# Patient Record
Sex: Female | Born: 1955 | Race: Black or African American | Hispanic: No | Marital: Single | Smoking: Former smoker
Health system: Southern US, Community
[De-identification: ages and names within clinical notes are randomized; demographics above are authoritative.]

## PROBLEM LIST (undated history)

## (undated) DIAGNOSIS — I739 Peripheral vascular disease, unspecified: Secondary | ICD-10-CM

## (undated) DIAGNOSIS — G35 Multiple sclerosis: Secondary | ICD-10-CM

## (undated) DIAGNOSIS — Z720 Tobacco use: Secondary | ICD-10-CM

## (undated) DIAGNOSIS — K219 Gastro-esophageal reflux disease without esophagitis: Secondary | ICD-10-CM

## (undated) DIAGNOSIS — M1990 Unspecified osteoarthritis, unspecified site: Secondary | ICD-10-CM

## (undated) DIAGNOSIS — G709 Myoneural disorder, unspecified: Secondary | ICD-10-CM

## (undated) DIAGNOSIS — N183 Chronic kidney disease, stage 3 (moderate): Secondary | ICD-10-CM

## (undated) DIAGNOSIS — E785 Hyperlipidemia, unspecified: Secondary | ICD-10-CM

## (undated) DIAGNOSIS — I10 Essential (primary) hypertension: Secondary | ICD-10-CM

## (undated) HISTORY — DX: Multiple sclerosis: G35

## (undated) HISTORY — DX: Peripheral vascular disease, unspecified: I73.9

## (undated) HISTORY — DX: Myoneural disorder, unspecified: G70.9

## (undated) HISTORY — DX: Tobacco use: Z72.0

## (undated) HISTORY — DX: Hyperlipidemia, unspecified: E78.5

---

## 1999-02-15 ENCOUNTER — Emergency Department (HOSPITAL_COMMUNITY): Admission: EM | Admit: 1999-02-15 | Discharge: 1999-02-15 | Payer: Self-pay

## 2001-07-24 ENCOUNTER — Other Ambulatory Visit: Admission: RE | Admit: 2001-07-24 | Discharge: 2001-07-24 | Payer: Self-pay | Admitting: *Deleted

## 2006-10-16 ENCOUNTER — Ambulatory Visit (HOSPITAL_COMMUNITY): Admission: RE | Admit: 2006-10-16 | Discharge: 2006-10-16 | Payer: Self-pay | Admitting: Cardiovascular Disease

## 2009-04-06 ENCOUNTER — Emergency Department (HOSPITAL_COMMUNITY): Admission: EM | Admit: 2009-04-06 | Discharge: 2009-04-06 | Payer: Self-pay | Admitting: Emergency Medicine

## 2009-04-08 ENCOUNTER — Emergency Department (HOSPITAL_COMMUNITY): Admission: EM | Admit: 2009-04-08 | Discharge: 2009-04-08 | Payer: Self-pay | Admitting: Emergency Medicine

## 2009-04-10 ENCOUNTER — Emergency Department (HOSPITAL_COMMUNITY): Admission: EM | Admit: 2009-04-10 | Discharge: 2009-04-10 | Payer: Self-pay | Admitting: Family Medicine

## 2012-01-09 ENCOUNTER — Emergency Department (HOSPITAL_COMMUNITY): Payer: Self-pay

## 2012-01-09 ENCOUNTER — Encounter (HOSPITAL_COMMUNITY): Payer: Self-pay | Admitting: Emergency Medicine

## 2012-01-09 ENCOUNTER — Other Ambulatory Visit: Payer: Self-pay

## 2012-01-09 ENCOUNTER — Observation Stay (HOSPITAL_COMMUNITY)
Admission: EM | Admit: 2012-01-09 | Discharge: 2012-01-10 | Disposition: A | Discharge status: Home / Self Care | Payer: Self-pay | Attending: Cardiovascular Disease | Admitting: Cardiovascular Disease

## 2012-01-09 DIAGNOSIS — R079 Chest pain, unspecified: Principal | ICD-10-CM | POA: Insufficient documentation

## 2012-01-09 DIAGNOSIS — K219 Gastro-esophageal reflux disease without esophagitis: Secondary | ICD-10-CM | POA: Insufficient documentation

## 2012-01-09 DIAGNOSIS — I10 Essential (primary) hypertension: Secondary | ICD-10-CM | POA: Insufficient documentation

## 2012-01-09 DIAGNOSIS — F17200 Nicotine dependence, unspecified, uncomplicated: Secondary | ICD-10-CM | POA: Insufficient documentation

## 2012-01-09 HISTORY — DX: Essential (primary) hypertension: I10

## 2012-01-09 HISTORY — DX: Gastro-esophageal reflux disease without esophagitis: K21.9

## 2012-01-09 LAB — COMPREHENSIVE METABOLIC PANEL
ALT: 16 U/L (ref 0–35)
AST: 25 U/L (ref 0–37)
Alkaline Phosphatase: 76 U/L (ref 39–117)
CO2: 24 mEq/L (ref 19–32)
GFR calc Af Amer: 41 mL/min — ABNORMAL LOW (ref >90)
GFR calc non Af Amer: 36 mL/min — ABNORMAL LOW (ref >90)
Glucose, Bld: 106 mg/dL — ABNORMAL HIGH (ref 70–99)
Potassium: 4.5 mEq/L (ref 3.5–5.1)
Sodium: 137 mEq/L (ref 135–145)
Total Protein: 7.6 g/dL (ref 6.0–8.3)

## 2012-01-09 LAB — DIFFERENTIAL
Eosinophils Relative: 2 % (ref 0–5)
Lymphocytes Relative: 36 % (ref 12–46)
Lymphocytes Relative: 39 % (ref 12–46)
Lymphs Abs: 2.8 10*3/uL (ref 0.7–4.0)
Lymphs Abs: 2.7 10*3/uL (ref 0.7–4.0)
Monocytes Absolute: 0.6 10*3/uL (ref 0.1–1.0)
Monocytes Relative: 8 % (ref 3–12)
Neutrophils Relative %: 49 % (ref 43–77)

## 2012-01-09 LAB — CBC
HCT: 34.5 % — ABNORMAL LOW (ref 36.0–46.0)
Hemoglobin: 11.5 g/dL — ABNORMAL LOW (ref 12.0–15.0)
Hemoglobin: 12.2 g/dL (ref 12.0–15.0)
MCV: 85.6 fL (ref 78.0–100.0)
Platelets: 207 10*3/uL (ref 150–400)
RBC: 4.03 MIL/uL (ref 3.87–5.11)
RBC: 4.17 MIL/uL (ref 3.87–5.11)
WBC: 7.7 10*3/uL (ref 4.0–10.5)
WBC: 6.8 10*3/uL (ref 4.0–10.5)

## 2012-01-09 LAB — CARDIAC PANEL(CRET KIN+CKTOT+MB+TROPI)
CK, MB: 2.8 ng/mL (ref 0.3–4.0)
Total CK: 255 U/L — ABNORMAL HIGH (ref 7–177)
Troponin I: <0.3 ng/mL (ref <0.30)

## 2012-01-09 LAB — PROTIME-INR
INR: 1.02 (ref 0.00–1.49)
Prothrombin Time: 13.6 seconds (ref 11.6–15.2)

## 2012-01-09 LAB — CK TOTAL AND CKMB (NOT AT ARMC): Relative Index: 1 (ref 0.0–2.5)

## 2012-01-09 MED ORDER — METOPROLOL TARTRATE 25 MG PO TABS
25.0000 mg | ORAL_TABLET | Freq: Two times a day (BID) | ORAL | Status: DC
  Administered 2012-01-10 (×2): 25 mg via ORAL
  Filled 2012-01-09 (×5): qty 1

## 2012-01-09 MED ORDER — HYDROCHLOROTHIAZIDE 12.5 MG PO CAPS
12.5000 mg | ORAL_CAPSULE | Freq: Every day | ORAL | Status: DC
  Administered 2012-01-10: 12.5 mg via ORAL
  Filled 2012-01-09 (×2): qty 1

## 2012-01-09 MED ORDER — ONDANSETRON HCL 4 MG/2ML IJ SOLN: 4.0000 mg | Freq: Four times a day (QID) | INTRAMUSCULAR | Status: DC | PRN

## 2012-01-09 MED ORDER — ASPIRIN 300 MG RE SUPP: 300.0000 mg | RECTAL | Status: AC

## 2012-01-09 MED ORDER — ZOLPIDEM TARTRATE 10 MG PO TABS: 10.0000 mg | ORAL_TABLET | Freq: Every evening | ORAL | Status: DC | PRN

## 2012-01-09 MED ORDER — SODIUM CHLORIDE 0.9 % IJ SOLN: 3.0000 mL | INTRAMUSCULAR | Status: DC | PRN

## 2012-01-09 MED ORDER — ROSUVASTATIN CALCIUM 10 MG PO TABS
10.0000 mg | ORAL_TABLET | Freq: Every day | ORAL | Status: DC
  Administered 2012-01-10: 10 mg via ORAL
  Filled 2012-01-09 (×3): qty 1

## 2012-01-09 MED ORDER — CLOPIDOGREL BISULFATE 75 MG PO TABS
75.0000 mg | ORAL_TABLET | Freq: Every day | ORAL | Status: DC
  Administered 2012-01-10: 75 mg via ORAL
  Filled 2012-01-09 (×2): qty 1

## 2012-01-09 MED ORDER — HEPARIN BOLUS VIA INFUSION
4000.0000 [IU] | Freq: Once | INTRAVENOUS | Status: AC
  Administered 2012-01-10: 4000 [IU] via INTRAVENOUS
  Filled 2012-01-09: qty 4000

## 2012-01-09 MED ORDER — ASPIRIN 81 MG PO CHEW
324.0000 mg | CHEWABLE_TABLET | ORAL | Status: AC
  Administered 2012-01-10: 324 mg via ORAL
  Filled 2012-01-09: qty 4

## 2012-01-09 MED ORDER — ASPIRIN EC 81 MG PO TBEC
81.0000 mg | DELAYED_RELEASE_TABLET | Freq: Every day | ORAL | Status: DC
  Administered 2012-01-10: 81 mg via ORAL
  Filled 2012-01-09 (×2): qty 1

## 2012-01-09 MED ORDER — SODIUM CHLORIDE 0.9 % IJ SOLN
3.0000 mL | Freq: Two times a day (BID) | INTRAMUSCULAR | Status: DC
  Administered 2012-01-10: 3 mL via INTRAVENOUS

## 2012-01-09 MED ORDER — CLOPIDOGREL BISULFATE 75 MG PO TABS
75.0000 mg | ORAL_TABLET | Freq: Once | ORAL | Status: AC
  Administered 2012-01-10: 75 mg via ORAL
  Filled 2012-01-09: qty 1

## 2012-01-09 MED ORDER — ALPRAZOLAM 0.25 MG PO TABS: 0.2500 mg | ORAL_TABLET | Freq: Two times a day (BID) | ORAL | Status: DC | PRN

## 2012-01-09 MED ORDER — OLMESARTAN MEDOXOMIL 20 MG PO TABS
20.0000 mg | ORAL_TABLET | Freq: Every day | ORAL | Status: DC
  Administered 2012-01-10: 20 mg via ORAL
  Filled 2012-01-09 (×2): qty 1

## 2012-01-09 MED ORDER — AMLODIPINE BESYLATE 5 MG PO TABS
5.0000 mg | ORAL_TABLET | Freq: Every day | ORAL | Status: DC
  Administered 2012-01-10: 5 mg via ORAL
  Filled 2012-01-09 (×2): qty 1

## 2012-01-09 MED ORDER — ACETAMINOPHEN 325 MG PO TABS: 650.0000 mg | ORAL_TABLET | ORAL | Status: DC | PRN

## 2012-01-09 MED ORDER — ASPIRIN 81 MG PO CHEW
324.0000 mg | CHEWABLE_TABLET | Freq: Once | ORAL | Status: AC
  Administered 2012-01-09: 324 mg via ORAL
  Filled 2012-01-09: qty 4

## 2012-01-09 MED ORDER — HEPARIN SOD (PORCINE) IN D5W 100 UNIT/ML IV SOLN
14.0000 [IU]/kg/h | INTRAVENOUS | Status: DC
  Administered 2012-01-10: 14 [IU]/kg/h via INTRAVENOUS
  Filled 2012-01-09: qty 250

## 2012-01-09 MED ORDER — SODIUM CHLORIDE 0.9 % IV SOLN: 250.0000 mL | INTRAVENOUS | Status: DC | PRN

## 2012-01-09 MED ORDER — NITROGLYCERIN 0.4 MG SL SUBL: 0.4000 mg | SUBLINGUAL_TABLET | SUBLINGUAL | Status: DC | PRN

## 2012-01-09 NOTE — H&P (Signed)
Ana Edwards is an 56 y.o. female.   Chief Complaint: Chest pain HPI: 56 years old black female with substernal chest pain, pressure type lasting for 5-10 minutes. No sweating or nausea. No radiation.  Past Medical History  Diagnosis Date  . GERD (gastroesophageal reflux disease)   . Hypertension       Past Surgical History  Procedure Date  . No past surgeries     History reviewed. No pertinent family history. Social History:  reports that she has been smoking.  She has never used smokeless tobacco. She reports that she does not drink alcohol or use illicit drugs.  Allergies:  Allergies  Allergen Reactions  . Tetracyclines & Related Nausea And Vomiting and Other (See Comments)    sweats    Medications Prior to Admission  Medication Dose Route Frequency Provider Last Rate Last Dose  . aspirin chewable tablet 324 mg  324 mg Oral Once Hilario Quarry, MD   324 mg at 01/09/12 1953   No current outpatient prescriptions on file as of 01/09/2012.    Results for orders placed during the hospital encounter of 01/09/12 (from the past 48 hour(s))  CBC     Status: Abnormal   Collection Time   01/09/12  7:44 PM      Component Value Range Comment   WBC 7.7  4.0 - 10.5 (K/uL)    RBC 4.03  3.87 - 5.11 (MIL/uL)    Hemoglobin 11.5 (*) 12.0 - 15.0 (g/dL)    HCT 36.6 (*) 44.0 - 46.0 (%)    MCV 85.6  78.0 - 100.0 (fL)    MCH 28.5  26.0 - 34.0 (pg)    MCHC 33.3  30.0 - 36.0 (g/dL)    RDW 34.7  42.5 - 95.6 (%)    Platelets 218  150 - 400 (K/uL)   DIFFERENTIAL     Status: Normal   Collection Time   01/09/12  7:44 PM      Component Value Range Comment   Neutrophils Relative 54  43 - 77 (%)    Neutro Abs 4.1  1.7 - 7.7 (K/uL)    Lymphocytes Relative 36  12 - 46 (%)    Lymphs Abs 2.8  0.7 - 4.0 (K/uL)    Monocytes Relative 8  3 - 12 (%)    Monocytes Absolute 0.6  0.1 - 1.0 (K/uL)    Eosinophils Relative 2  0 - 5 (%)    Eosinophils Absolute 0.2  0.0 - 0.7 (K/uL)    Basophils Relative 1   0 - 1 (%)    Basophils Absolute 0.0  0.0 - 0.1 (K/uL)   CK TOTAL AND CKMB     Status: Abnormal   Collection Time   01/09/12  7:44 PM      Component Value Range Comment   Total CK 301 (*) 7 - 177 (U/L) NO VISIBLE HEMOLYSIS   CK, MB 3.0  0.3 - 4.0 (ng/mL)    Relative Index 1.0  0.0 - 2.5    COMPREHENSIVE METABOLIC PANEL     Status: Abnormal   Collection Time   01/09/12  7:44 PM      Component Value Range Comment   Sodium 137  135 - 145 (mEq/L)    Potassium 4.5  3.5 - 5.1 (mEq/L) NO VISIBLE HEMOLYSIS   Chloride 104  96 - 112 (mEq/L)    CO2 24  19 - 32 (mEq/L)    Glucose, Bld 106 (*) 70 - 99 (  mg/dL)    BUN 19  6 - 23 (mg/dL)    Creatinine, Ser 4.09 (*) 0.50 - 1.10 (mg/dL)    Calcium 9.7  8.4 - 10.5 (mg/dL)    Total Protein 7.6  6.0 - 8.3 (g/dL)    Albumin 3.9  3.5 - 5.2 (g/dL)    AST 25  0 - 37 (U/L) NO VISIBLE HEMOLYSIS   ALT 16  0 - 35 (U/L)    Alkaline Phosphatase 76  39 - 117 (U/L)    Total Bilirubin 0.3  0.3 - 1.2 (mg/dL)    GFR calc non Af Amer 36 (*) >90 (mL/min)    GFR calc Af Amer 41 (*) >90 (mL/min)   TROPONIN I     Status: Normal   Collection Time   01/09/12  7:44 PM      Component Value Range Comment   Troponin I <0.30  <0.30 (ng/mL)    Dg Chest Port 1 View  01/09/2012  *RADIOLOGY REPORT*  Clinical Data: Chest pain.  PORTABLE CHEST - 1 VIEW  Comparison: None.  Findings: Heart size and pulmonary vascularity are normal and the lungs are clear.  No osseous abnormality.  IMPRESSION: No acute disease.  Original Report Authenticated By: Gwynn Burly, M.D.    @ROS @  Blood pressure 124/61, pulse 53, temperature 98.3 F (36.8 C), temperature source Oral, resp. rate 16, SpO2 99.00%. Constitutional: She is oriented to person, place, and time. She appears well-developed and well-nourished.  Head: Normocephalic and atraumatic.  Eyes: Brown eyes, Conjunctivae and EOM are normal. Pupils are equal, round, and reactive to light.  Neck: Normal range of motion. Neck supple. No  JVD. Cardiovascular: Normal rate, regular rhythm and normal heart sounds.  Pulmonary/Chest: Effort normal and breath sounds normal.  Abdominal: Soft. Bowel sounds are normal.  Musculoskeletal: Normal range of motion.  Neurological: She is alert and oriented to person, place, and time. She has normal reflexes.  Skin: Skin is warm and dry.  Psychiatric: She has a normal mood and affect.     Assessment/Plan Chest pain Hypertension  Place in observation R/O MI Stress test IP/OP.  Dam Ashraf S 01/09/2012, 9:24 PM

## 2012-01-09 NOTE — ED Notes (Signed)
Per pt, chest pain/discomfort over past week-sx's increased today-no n/v, dizziness, no radiation

## 2012-01-09 NOTE — ED Provider Notes (Signed)
History     CSN: [id number]  Arrival date & time 01/09/12  1752   First MD Initiated Contact with Patient 01/09/12 1914      Chief Complaint  Patient presents with  . Chest Pain    (Consider location/radiation/quality/duration/timing/severity/associated sxs/prior treatment) HPI Pain upper chest pressure in nature comes and goes for a week.  Today returned.  Patient saw DR. Algie Coffer last week and gave amoxicillin for possible early pneumonia.  Patient continues to have chest pain.  Pain not present currently.  Pain lasts 5-10 minutes.  Pain occurs with exertion.  Denies fh cad, no dm- patient with hypertension and smoker.  Pain is different from gerd. Pain 4/10 at worst.  Past Medical History  Diagnosis Date  . GERD (gastroesophageal reflux disease)   . Hypertension    reviewed History reviewed. No pertinent past surgical history.  No family history on file.  History  Substance Use Topics  . Smoking status: Current Everyday Smoker -- 0.5 packs/day  . Smokeless tobacco: Not on file  . Alcohol Use:     OB History    Grav Para Term Preterm Abortions TAB SAB Ect Mult Living                  Review of Systems  All other systems reviewed and are negative.    Allergies  Tetracyclines & related  Home Medications   Current Outpatient Rx  Name Route Sig Dispense Refill  . AMOXICILLIN 500 MG PO TABS Oral Take 500 mg by mouth every 8 (eight) hours.    Marland Kitchen OLMESARTAN-AMLODIPINE-HCTZ 20-5-12.5 MG PO TABS Oral Take 1 tablet by mouth daily.    Marland Kitchen OMEPRAZOLE 20 MG PO CPDR Oral Take 20 mg by mouth daily as needed. Acid reflux      BP 120/63  Pulse 52  Temp(Src) 98.3 F (36.8 C) (Oral)  Resp 16  SpO2 100%  Physical Exam  Nursing note and vitals reviewed. Constitutional: She is oriented to person, place, and time. She appears well-developed and well-nourished.  HENT:  Head: Normocephalic and atraumatic.  Eyes: Conjunctivae and EOM are normal. Pupils are equal, round, and  reactive to light.  Neck: Normal range of motion. Neck supple.  Cardiovascular: Normal rate, regular rhythm and normal heart sounds.   Pulmonary/Chest: Effort normal and breath sounds normal.  Abdominal: Soft. Bowel sounds are normal.  Musculoskeletal: Normal range of motion.  Neurological: She is alert and oriented to person, place, and time. She has normal reflexes.  Skin: Skin is warm and dry.  Psychiatric: She has a normal mood and affect.    ED Course  Procedures (including critical care time)  Labs Reviewed - No data to display No results found.   No diagnosis found.  Date: 01/09/2012  Rate: 56  Rhythm: normal sinus rhythm  QRS Axis: normal  Intervals: normal  ST/T Wave abnormalities: nonspecific ST changes  Conduction Disutrbances:none  Narrative Interpretation:   Old EKG Reviewed: none available     MDM  I spoke with Dr. Fausto Skillern on call for cardiology and he will be in to see the patient. She has not had any pain here and has received aspirin.       Hilario Quarry, MD 01/09/12 2024

## 2012-01-10 ENCOUNTER — Other Ambulatory Visit: Payer: Self-pay

## 2012-01-10 LAB — LIPID PANEL
Cholesterol: 197 mg/dL (ref 0–200)
HDL: 50 mg/dL (ref >39)
Total CHOL/HDL Ratio: 3.9 RATIO
Triglycerides: 64 mg/dL (ref <150)
VLDL: 13 mg/dL (ref 0–40)

## 2012-01-10 LAB — BASIC METABOLIC PANEL
BUN: 21 mg/dL (ref 6–23)
Calcium: 9.3 mg/dL (ref 8.4–10.5)
Creatinine, Ser: 1.75 mg/dL — ABNORMAL HIGH (ref 0.50–1.10)
GFR calc Af Amer: 37 mL/min — ABNORMAL LOW (ref >90)
GFR calc non Af Amer: 32 mL/min — ABNORMAL LOW (ref >90)

## 2012-01-10 LAB — CBC
HCT: 34 % — ABNORMAL LOW (ref 36.0–46.0)
MCHC: 33.5 g/dL (ref 30.0–36.0)
MCV: 85.4 fL (ref 78.0–100.0)
Platelets: 217 10*3/uL (ref 150–400)
RDW: 13.5 % (ref 11.5–15.5)

## 2012-01-10 LAB — HEPARIN LEVEL (UNFRACTIONATED): Heparin Unfractionated: 0.7 IU/mL (ref 0.30–0.70)

## 2012-01-10 LAB — CARDIAC PANEL(CRET KIN+CKTOT+MB+TROPI): Total CK: 234 U/L — ABNORMAL HIGH (ref 7–177)

## 2012-01-10 MED ORDER — HEPARIN SOD (PORCINE) IN D5W 100 UNIT/ML IV SOLN
950.0000 [IU]/h | INTRAVENOUS | Status: DC
  Filled 2012-01-10: qty 250

## 2012-01-10 MED ORDER — ROSUVASTATIN CALCIUM 10 MG PO TABS: 10.0000 mg | ORAL_TABLET | Freq: Every day | ORAL | Status: DC

## 2012-01-10 MED ORDER — CLOPIDOGREL BISULFATE 75 MG PO TABS: 75.0000 mg | ORAL_TABLET | Freq: Every day | ORAL | Status: AC

## 2012-01-10 NOTE — Discharge Summary (Signed)
Physician Discharge Summary  Patient ID: Ana Edwards MRN: [id number] DOB/AGE: [birth year] 56 y.o.  Admit date: 01/09/2012 Discharge date: 01/10/2012  Admission Diagnoses: Chest pain Hypertension Tobacco use disorder  Discharge Diagnoses:  Principal Problem:  *Chest pain at rest Active Problems:  Hypertension Tobacco use disorder  Discharged Condition: good  Hospital Course: 56 years old black female with hypertension and tobacco use disorder had substernal chest pain. Cardiac enzymes were negative x 3.   No chest pain since admission. Patient prefers treadmill stress test done as out patient.  Consults: cardiology  Significant Diagnostic Studies: labs: Near normal CBC, BMET and cardiac enzymes.Mildly elevated LDL cholesterol.   Treatments: cardiac meds: metoprolol and HCTZ,   Discharge Exam: Blood pressure 96/51, pulse 57, temperature 97.6 F (36.4 C), temperature source Oral, resp. rate 18, height 5\' 11"  (1.803 m), weight 74.481 kg (164 lb 3.2 oz), SpO2 100.00%. Constitutional: She is oriented to person, place, and time. She appears well-developed and well-nourished.  Head: Normocephalic and atraumatic.  Eyes: Brown eyes, Conjunctivae and EOM are normal. Pupils are equal, round, and reactive to light.  Neck: Normal range of motion. Neck supple. No JVD.  Cardiovascular: Normal rate, regular rhythm and normal heart sounds.  Pulmonary/Chest: Effort normal and breath sounds normal.  Abdominal: Soft. Bowel sounds are normal.  Musculoskeletal: Normal range of motion.  Neurological: She is alert and oriented to person, place, and time. She has normal reflexes.  Skin: Skin is warm and dry.  Psychiatric: She has a normal mood and affect.  Disposition: Home/self care                    Medication List  As of 01/10/2012  3:26 PM   ASK your doctor about these medications         amoxicillin 500 MG tablet   Commonly known as: AMOXIL   Take 500 mg by mouth every 8 (eight)  hours.      omeprazole 20 MG capsule   Commonly known as: PRILOSEC   Take 20 mg by mouth daily as needed. Acid reflux      TRIBENZOR 20-5-12.5 MG Tabs   Generic drug: Olmesartan-Amlodipine-HCTZ   Take 1 tablet by mouth daily.             SignedOrpah Cobb S 01/10/2012, 3:26 PM

## 2012-01-10 NOTE — Progress Notes (Signed)
ANTICOAGULATION CONSULT NOTE - Follow Up Consult  Pharmacy Consult for Heparin Indication: chest pain/ACS  Allergies  Allergen Reactions  . Tetracyclines & Related Nausea And Vomiting and Other (See Comments)    sweats    Patient Measurements: Height: 5\' 11"  (180.3 cm) Weight: 164 lb 3.2 oz (74.481 kg) (standing scale) IBW/kg (Calculated) : 70.8   Vital Signs: Temp: 98.3 F (36.8 C) (01/22 0524) Temp src: Oral (01/22 0524) BP: 112/69 mmHg (01/22 0524) Pulse Rate: 57  (01/22 0524)  Labs:  Basename 01/10/12 0802 01/10/12 0500 01/10/12 0445 01/09/12 2255 01/09/12 1944  HGB -- 11.4* -- 12.2 --  HCT -- 34.0* -- 35.6* 34.5*  PLT -- 217 -- 207 218  APTT -- -- -- 28 --  LABPROT -- -- -- 13.6 --  INR -- -- -- 1.02 --  HEPARINUNFRC 0.70 -- -- -- --  CREATININE -- -- 1.75* -- 1.59*  CKTOTAL -- -- 234* 255* 301*  CKMB -- -- 2.7 2.8 3.0  TROPONINI -- -- <0.30 <0.30 <0.30   Estimated Creatinine Clearance: 40.6 ml/min (by C-G formula based on Cr of 1.75).   Medications:  Scheduled:    . amLODipine  5 mg Oral Daily  . aspirin  324 mg Oral Once  . aspirin  324 mg Oral NOW   Or  . aspirin  300 mg Rectal NOW  . aspirin EC  81 mg Oral Daily  . clopidogrel  75 mg Oral Once  . clopidogrel  75 mg Oral Q breakfast  . heparin  4,000 Units Intravenous Once  . hydrochlorothiazide  12.5 mg Oral Daily  . metoprolol tartrate  25 mg Oral BID  . olmesartan  20 mg Oral Daily  . rosuvastatin  10 mg Oral q1800  . sodium chloride  3 mL Intravenous Q12H   Infusions:    . heparin 14 Units/kg/hr (01/10/12 0030)   PRN: sodium chloride, acetaminophen, ALPRAZolam, nitroGLYCERIN, ondansetron (ZOFRAN) IV, sodium chloride, zolpidem  Assessment: 56 yo F on IV heparin for chest pain, r/o MI. Heparin currently running at 1050 units/hr. Heparin level at upper end of therapeutic range. No bleeding complications reported in chart. Will reduce heparin rate and recheck level in 6 hours.  Goal of  Therapy:  Heparin level 0.3-0.7 units/ml   Plan:  1)  Decrease heparin to 950 units/hr (9.68ml/hr) 2)  Recheck heparin level in 6 hours (15:30)   Annia Belt, PharmD Pager: [phone] 01/10/2012,8:58 AM

## 2012-01-10 NOTE — Progress Notes (Signed)
ANTICOAGULATION CONSULT NOTE - Initial Consult  Pharmacy Consult for Heparin Indication: chest pain/ACS  Allergies  Allergen Reactions  . Tetracyclines & Related Nausea And Vomiting and Other (See Comments)    sweats    Patient Measurements: Height: 5\' 11"  (180.3 cm) Weight: 164 lb 14.4 oz (74.798 kg) (standing weight) IBW/kg (Calculated) : 70.8  Heparin Dosing Weight:   Vital Signs: Temp: 98 F (36.7 C) (01/21 2306) Temp src: Oral (01/21 2306) BP: 109/58 mmHg (01/22 0026) Pulse Rate: 61  (01/22 0026)  Labs:  Basename 01/09/12 2255 01/09/12 1944  HGB 12.2 11.5*  HCT 35.6* 34.5*  PLT 207 218  APTT 28 --  LABPROT 13.6 --  INR 1.02 --  HEPARINUNFRC -- --  CREATININE -- 1.59*  CKTOTAL 255* 301*  CKMB 2.8 3.0  TROPONINI <0.30 <0.30   Estimated Creatinine Clearance: 44.7 ml/min (by C-G formula based on Cr of 1.59).  Medical History: Past Medical History  Diagnosis Date  . GERD (gastroesophageal reflux disease)   . Hypertension     Medications:  Infusions:    . heparin 14 Units/kg/hr (01/10/12 0030)    Assessment: Patient with ACS and heparin per pharmacy.  Goal of Therapy:  Heparin level 0.3-0.7 units/ml   Plan:  Heparin bolus and then drip at 1050 units/hr, check heparin level at 0800 and daily.  Darlina Guys, Jacquenette Shone Crowford 01/10/2012,3:19 AM

## 2012-01-10 NOTE — Progress Notes (Signed)
Patient discharge to home, ambulatory, PIV removed no s/s of infiltration, no swelling noted, D/C instructions given and discussed  to patient verbalized understandingh.

## 2013-01-19 DEATH — deceased

## 2013-12-30 ENCOUNTER — Ambulatory Visit: Payer: Self-pay | Admitting: Podiatry

## 2014-01-06 ENCOUNTER — Encounter: Payer: Self-pay | Admitting: Podiatry

## 2014-01-06 ENCOUNTER — Ambulatory Visit (INDEPENDENT_AMBULATORY_CARE_PROVIDER_SITE_OTHER): Payer: BC Managed Care – PPO

## 2014-01-06 ENCOUNTER — Ambulatory Visit (INDEPENDENT_AMBULATORY_CARE_PROVIDER_SITE_OTHER): Payer: BC Managed Care – PPO | Admitting: Podiatry

## 2014-01-06 VITALS — BP 149/80 | HR 67 | Resp 18

## 2014-01-06 DIAGNOSIS — R0989 Other specified symptoms and signs involving the circulatory and respiratory systems: Secondary | ICD-10-CM

## 2014-01-06 DIAGNOSIS — S93609A Unspecified sprain of unspecified foot, initial encounter: Secondary | ICD-10-CM

## 2014-01-06 DIAGNOSIS — M79609 Pain in unspecified limb: Secondary | ICD-10-CM

## 2014-01-06 NOTE — Patient Instructions (Signed)
Today I am ordering an arterial Doppler because I had difficulty feeling your pulses in your feet. We'll help you arranged this examination and will notify you up on receipt of the results.

## 2014-01-06 NOTE — Progress Notes (Signed)
   Subjective:    Patient ID: Ana Edwards, female    DOB: 06/23/1956, 58 y.o.   MRN: [id number]  HPI my left foot has been bothering me since may and I went on a cruise and fell down a step and bruised up and is swellling and hooked my 3rd and 4th toe on a piece of furniture 2 months ago and hurts at night and hurts if I stand a long time  This patient describes 2 episodes of trauma to the left foot. First episode June 2014 describes some bruising forefoot area which appeared to be improving with ice and rest. She then describes "hooking"  her toes and November 2014. Now she describes some continuous pain in the dorsum of left foot and lateral toe area of the left foot  Review of Systems  Constitutional: Negative.   HENT: Negative.   Eyes: Negative.   Respiratory: Negative.   Cardiovascular: Negative.   Gastrointestinal: Negative.   Endocrine: Negative.   Genitourinary: Negative.   Musculoskeletal: Negative.   Skin: Negative.   Allergic/Immunologic: Negative.   Neurological: Negative.   Hematological: Negative.   Psychiatric/Behavioral: Negative.        Objective:   Physical Exam Orientated x3 female  Vascular DP and PT pulses are trace palpable bilaterally  Neurological: Knee and ankle reflexes equal and reactive bilaterally  Dermatological: Texture and turgor within normal limits bilaterally  Musculoskeletal: Bilateral HAV deformities noted bilaterally. No restriction ankle subtalar midtarsal joint bilaterally mild palpable tenderness dorsal left midfoot and lateral toes left without a palpable lesions. Manual muscle testing dorsi flexion, plantar flexion, inversion, eversion are 5 over 5 bilaterally.  X-ray did not demonstrate any fractures and a left foot       Assessment & Plan:   Assessment: Sprain left foot Rule out performed to disease because are diminished pedal pulses bilaterally  Plan: Patient advised to wear a sturdy lace up shoe on a daily basis.  Refer patient to vascular lab for lower extremity arterial Doppler for the indication are diminished pedal pulses bilaterally. Notify patient on receipt of vascular exam.

## 2014-01-07 ENCOUNTER — Telehealth (HOSPITAL_COMMUNITY): Payer: Self-pay | Admitting: *Deleted

## 2014-01-13 ENCOUNTER — Telehealth (HOSPITAL_COMMUNITY): Payer: Self-pay | Admitting: *Deleted

## 2014-01-21 ENCOUNTER — Telehealth (HOSPITAL_COMMUNITY): Payer: Self-pay | Admitting: *Deleted

## 2014-01-24 ENCOUNTER — Telehealth: Payer: Self-pay | Admitting: *Deleted

## 2014-01-24 ENCOUNTER — Encounter: Payer: Self-pay | Admitting: Cardiovascular Disease

## 2014-01-24 ENCOUNTER — Ambulatory Visit (HOSPITAL_COMMUNITY)
Admission: RE | Admit: 2014-01-24 | Discharge: 2014-01-24 | Disposition: A | Discharge status: Home / Self Care | Payer: BC Managed Care – PPO | Source: Ambulatory Visit | Attending: Cardiovascular Disease | Admitting: Cardiovascular Disease

## 2014-01-24 ENCOUNTER — Ambulatory Visit (INDEPENDENT_AMBULATORY_CARE_PROVIDER_SITE_OTHER): Payer: BC Managed Care – PPO | Admitting: Cardiovascular Disease

## 2014-01-24 VITALS — BP 138/70 | HR 68 | Ht 70.5 in | Wt 166.0 lb

## 2014-01-24 DIAGNOSIS — R5383 Other fatigue: Secondary | ICD-10-CM

## 2014-01-24 DIAGNOSIS — R0989 Other specified symptoms and signs involving the circulatory and respiratory systems: Secondary | ICD-10-CM

## 2014-01-24 DIAGNOSIS — I739 Peripheral vascular disease, unspecified: Secondary | ICD-10-CM

## 2014-01-24 DIAGNOSIS — D689 Coagulation defect, unspecified: Secondary | ICD-10-CM

## 2014-01-24 DIAGNOSIS — R5381 Other malaise: Secondary | ICD-10-CM

## 2014-01-24 DIAGNOSIS — I70219 Atherosclerosis of native arteries of extremities with intermittent claudication, unspecified extremity: Secondary | ICD-10-CM | POA: Insufficient documentation

## 2014-01-24 DIAGNOSIS — Z79899 Other long term (current) drug therapy: Secondary | ICD-10-CM

## 2014-01-24 DIAGNOSIS — Z01818 Encounter for other preprocedural examination: Secondary | ICD-10-CM

## 2014-01-24 NOTE — Progress Notes (Signed)
Arterial Lower Ext. Duplex Completed. Cleophus Mendonsa, BS, RDMS, RVT  

## 2014-01-24 NOTE — Assessment & Plan Note (Signed)
The patient was referred by Dr. Durel Salts from [facility] for bilateral foot pain left greater than right as well as what sounds like claudication. Her risk factors include continued tobacco abuse and treated hypertension. She's never had a heart attack or stroke, but denies chest pain or shortness of breath. Dopplers performed in the office showed multilevel of obstructive disease with moderately diminished ABIs. Admission she by angiography and potential percutaneous intervention for lifestyle limiting claudication.

## 2014-01-24 NOTE — Patient Instructions (Addendum)
Dr. Allyson Sabal has ordered a peripheral angiogram to be done at [facility].  This procedure is going to look at the bloodflow in your lower extremities.  If Dr. Allyson Sabal is able to open up the arteries, you will have to spend one night in the hospital.  If he is not able to open the arteries, you will be able to go home that same day.    After the procedure, you will not be allowed to drive for 3 days or push, pull, or lift anything greater than 10 lbs for one week.    You will be required to have bloodwork prior to your procedure.  Our scheduler will advise you on when this needs to be done.       REPS: Vernia Buff and Minerva Areola

## 2014-01-24 NOTE — Telephone Encounter (Signed)
Pt is schedule today at 1100am with Dr Nanetta BattyJonathan Berry after dopplers results are positive for arterial disease B/L.  I will inform Dr Leeanne Deeduchman.

## 2014-01-24 NOTE — Progress Notes (Signed)
   01/24/2014 Marye T Burgard   05/27/1956  [id number]  Primary Physician KADAKIA,AJAY S, MD Primary Cardiologist: Rosaline Ezekiel J. Iisha Soyars MD FACP,FACC,FAHA, FSCAI   HPI:  Ana Edwards is a 57-year-old moderately overweight divorced African American female mother of 4, grandmother scheduled and referred by Dr. Richard Tuchman for peripheral vascular evaluation because of life limiting claudication. Her risk factors include continued tobacco abuse and hypertension. She's not diabetic nor is a family history of heart disease. She has never had a heart attack or stroke patient denies chest pain or shortness of breath. 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 The patient relates about code last 6 months. Left is greater than right.   Current Outpatient Prescriptions  Medication Sig Dispense Refill  . amLODipine (NORVASC) 5 MG tablet Take 5 mg by mouth daily.       . metoprolol (LOPRESSOR) 50 MG tablet Take 50 mg by mouth 2 (two) times daily.      . olmesartan (BENICAR) 20 MG tablet Take 10 mg by mouth daily.      . omeprazole (PRILOSEC) 20 MG capsule Take 20 mg by mouth daily as needed. Acid reflux       No current facility-administered medications for this visit.    Allergies  Allergen Reactions  . Tetracyclines & Related Nausea And Vomiting and Other (See Comments)    sweats    History   Social History  . Marital Status: Married    Spouse Name: N/A    Number of Children: N/A  . Years of Education: N/A   Occupational History  . Not on file.   Social History Main Topics  . Smoking status: Current Some Day Smoker -- 15 years  . Smokeless tobacco: Never Used     Comment: 1 pack per week  . Alcohol Use: No  . Drug Use: No  . Sexual Activity: Not on file   Other Topics Concern  . Not on file   Social History Narrative  . No narrative on file     Review of Systems: General: negative for chills,  fever, night sweats or weight changes.  Cardiovascular: negative for chest pain, dyspnea on exertion, edema, orthopnea, palpitations, paroxysmal nocturnal dyspnea or shortness of breath Dermatological: negative for rash Respiratory: negative for cough or wheezing Urologic: negative for hematuria Abdominal: negative for nausea, vomiting, diarrhea, bright red blood per rectum, melena, or hematemesis Neurologic: negative for visual changes, syncope, or dizziness All other systems reviewed and are otherwise negative except as noted above.    Blood pressure 138/70, pulse 68, height 5' 10.5" (1.791 m), weight 166 lb (75.297 kg).  General appearance: alert and no distress Neck: no adenopathy, no carotid bruit, no JVD, supple, symmetrical, trachea midline and thyroid not enlarged, symmetric, no tenderness/mass/nodules Lungs: clear to auscultation bilaterally Heart: regular rate and rhythm, S1, S2 normal, no murmur, click, rub or gallop Abdomen: soft, non-tender; bowel sounds normal; no masses,  no organomegaly Extremities: extremities normal, atraumatic, no cyanosis or edema and absent pedal pulses  EKG not performed today  ASSESSMENT AND PLAN:   Claudication The patient was referred by Dr. Richard Tuckman from [facility] for bilateral foot pain left greater than right as well as what sounds like claudication. Her risk factors include continued tobacco abuse and treated hypertension. She's never had a heart attack or stroke, but denies chest pain or shortness of breath.   Dopplers performed in the office showed multilevel of obstructive disease with moderately diminished ABIs. Admission she by angiography and potential percutaneous intervention for lifestyle limiting claudication.      Runell Gess MD FACP,FACC,FAHA, [facility] 01/24/2014 12:10 PM

## 2014-01-29 ENCOUNTER — Encounter (HOSPITAL_COMMUNITY): Payer: Self-pay | Admitting: Pharmacy Technician

## 2014-01-29 LAB — CBC
HEMATOCRIT: 38 % (ref 36.0–46.0)
Hemoglobin: 12.8 g/dL (ref 12.0–15.0)
MCH: 28.5 pg (ref 26.0–34.0)
MCHC: 33.7 g/dL (ref 30.0–36.0)
MCV: 84.6 fL (ref 78.0–100.0)
Platelets: 269 10*3/uL (ref 150–400)
RBC: 4.49 MIL/uL (ref 3.87–5.11)
RDW: 15 % (ref 11.5–15.5)
WBC: 6.4 10*3/uL (ref 4.0–10.5)

## 2014-01-29 LAB — BASIC METABOLIC PANEL
BUN: 14 mg/dL (ref 6–23)
CALCIUM: 9.1 mg/dL (ref 8.4–10.5)
CHLORIDE: 105 meq/L (ref 96–112)
CO2: 29 meq/L (ref 19–32)
Creat: 1.31 mg/dL — ABNORMAL HIGH (ref 0.50–1.10)
GLUCOSE: 98 mg/dL (ref 70–99)
POTASSIUM: 4.2 meq/L (ref 3.5–5.3)
SODIUM: 139 meq/L (ref 135–145)

## 2014-01-29 LAB — PROTIME-INR
INR: 0.98 (ref <1.50)
Prothrombin Time: 12.9 seconds (ref 11.6–15.2)

## 2014-01-29 LAB — APTT: aPTT: 27 seconds (ref 24–37)

## 2014-01-30 ENCOUNTER — Telehealth: Payer: Self-pay | Admitting: Cardiovascular Disease

## 2014-01-30 ENCOUNTER — Ambulatory Visit
Admission: RE | Admit: 2014-01-30 | Discharge: 2014-01-30 | Disposition: A | Discharge status: Home / Self Care | Payer: BC Managed Care – PPO | Source: Ambulatory Visit | Attending: Cardiovascular Disease | Admitting: Cardiovascular Disease

## 2014-01-30 DIAGNOSIS — I739 Peripheral vascular disease, unspecified: Secondary | ICD-10-CM

## 2014-01-30 LAB — TSH: TSH: 2.566 u[IU]/mL (ref 0.350–4.500)

## 2014-01-30 NOTE — Telephone Encounter (Signed)
Ruby at [facility] stated pt arrived for CXR and has no order.  Chart reviewed and no order placed.  Samara Deist, RN notified and advised CXR be ordered as pt is a new pt eval for claudication.  Orders placed and Ruby notified.  Verbalized understanding.  Order released.

## 2014-01-31 NOTE — Progress Notes (Signed)
Dr. Leeanne Deed,  Pt had dopplers and saw Dr. Allyson Sabal on 01/24/2014.  Pt is scheduled for an angiogram on 02/03/2014.  Joya San

## 2014-02-03 ENCOUNTER — Encounter (HOSPITAL_COMMUNITY): Payer: Self-pay | Admitting: General Practice

## 2014-02-03 ENCOUNTER — Ambulatory Visit (HOSPITAL_COMMUNITY)
Admission: RE | Admit: 2014-02-03 | Discharge: 2014-02-04 | Disposition: A | Discharge status: Home / Self Care | Payer: BC Managed Care – PPO | Source: Ambulatory Visit | Attending: Cardiovascular Disease | Admitting: Cardiovascular Disease

## 2014-02-03 ENCOUNTER — Encounter (HOSPITAL_COMMUNITY)
Admission: RE | Disposition: A | Discharge status: Home / Self Care | Payer: Self-pay | Source: Ambulatory Visit | Attending: Cardiovascular Disease

## 2014-02-03 DIAGNOSIS — N183 Chronic kidney disease, stage 3 unspecified: Secondary | ICD-10-CM | POA: Insufficient documentation

## 2014-02-03 DIAGNOSIS — Z01818 Encounter for other preprocedural examination: Secondary | ICD-10-CM

## 2014-02-03 DIAGNOSIS — I70219 Atherosclerosis of native arteries of extremities with intermittent claudication, unspecified extremity: Secondary | ICD-10-CM

## 2014-02-03 DIAGNOSIS — E663 Overweight: Secondary | ICD-10-CM | POA: Insufficient documentation

## 2014-02-03 DIAGNOSIS — I10 Essential (primary) hypertension: Secondary | ICD-10-CM | POA: Diagnosis present

## 2014-02-03 DIAGNOSIS — F17200 Nicotine dependence, unspecified, uncomplicated: Secondary | ICD-10-CM | POA: Insufficient documentation

## 2014-02-03 DIAGNOSIS — K219 Gastro-esophageal reflux disease without esophagitis: Secondary | ICD-10-CM | POA: Insufficient documentation

## 2014-02-03 DIAGNOSIS — I129 Hypertensive chronic kidney disease with stage 1 through stage 4 chronic kidney disease, or unspecified chronic kidney disease: Secondary | ICD-10-CM | POA: Insufficient documentation

## 2014-02-03 DIAGNOSIS — I739 Peripheral vascular disease, unspecified: Secondary | ICD-10-CM

## 2014-02-03 DIAGNOSIS — D649 Anemia, unspecified: Secondary | ICD-10-CM | POA: Insufficient documentation

## 2014-02-03 HISTORY — PX: PERIPHERAL ATHRECTOMY: SHX6227

## 2014-02-03 HISTORY — DX: Chronic kidney disease, stage 3 (moderate): N18.3

## 2014-02-03 HISTORY — DX: Peripheral vascular disease, unspecified: I73.9

## 2014-02-03 HISTORY — DX: Unspecified osteoarthritis, unspecified site: M19.90

## 2014-02-03 HISTORY — PX: LOWER EXTREMITY ANGIOGRAM: SHX5508

## 2014-02-03 LAB — POCT ACTIVATED CLOTTING TIME
ACTIVATED CLOTTING TIME: 182 s
ACTIVATED CLOTTING TIME: 210 s
ACTIVATED CLOTTING TIME: 243 s
ACTIVATED CLOTTING TIME: 215 s
Activated Clotting Time: 232 seconds
Activated Clotting Time: 160 seconds

## 2014-02-03 LAB — GLUCOSE, CAPILLARY: Glucose-Capillary: 110 mg/dL — ABNORMAL HIGH (ref 70–99)

## 2014-02-03 SURGERY — ANGIOGRAM, LOWER EXTREMITY
Anesthesia: LOCAL | Laterality: Right

## 2014-02-03 MED ORDER — CLONIDINE HCL 0.1 MG PO TABS
0.1000 mg | ORAL_TABLET | ORAL | Status: AC
  Administered 2014-02-03: 15:00:00 0.1 mg via ORAL
  Filled 2014-02-03: qty 1

## 2014-02-03 MED ORDER — IRBESARTAN 150 MG PO TABS
150.0000 mg | ORAL_TABLET | Freq: Every day | ORAL | Status: DC
  Filled 2014-02-03: qty 1

## 2014-02-03 MED ORDER — MORPHINE SULFATE 2 MG/ML IJ SOLN: 2.0000 mg | INTRAMUSCULAR | Status: DC | PRN

## 2014-02-03 MED ORDER — DIAZEPAM 5 MG PO TABS
5.0000 mg | ORAL_TABLET | ORAL | Status: AC
  Administered 2014-02-03: 5 mg via ORAL
  Filled 2014-02-03: qty 1

## 2014-02-03 MED ORDER — ASPIRIN EC 81 MG PO TBEC: 81.0000 mg | DELAYED_RELEASE_TABLET | ORAL | Status: DC

## 2014-02-03 MED ORDER — SODIUM CHLORIDE 0.9 % IJ SOLN: 3.0000 mL | INTRAMUSCULAR | Status: DC | PRN

## 2014-02-03 MED ORDER — ASPIRIN 81 MG PO CHEW
81.0000 mg | CHEWABLE_TABLET | ORAL | Status: DC
  Filled 2014-02-03: qty 1

## 2014-02-03 MED ORDER — CLOPIDOGREL BISULFATE 75 MG PO TABS
300.0000 mg | ORAL_TABLET | Freq: Once | ORAL | Status: AC
  Administered 2014-02-03: 15:00:00 300 mg via ORAL

## 2014-02-03 MED ORDER — SODIUM CHLORIDE 0.9 % IV SOLN
INTRAVENOUS | Status: DC
  Administered 2014-02-03: 10:00:00 via INTRAVENOUS

## 2014-02-03 MED ORDER — METOPROLOL TARTRATE 25 MG PO TABS
25.0000 mg | ORAL_TABLET | Freq: Two times a day (BID) | ORAL | Status: DC
  Administered 2014-02-03: 25 mg via ORAL
  Filled 2014-02-03 (×3): qty 1

## 2014-02-03 MED ORDER — PANTOPRAZOLE SODIUM 40 MG PO TBEC
40.0000 mg | DELAYED_RELEASE_TABLET | Freq: Every day | ORAL | Status: DC
  Administered 2014-02-04: 11:00:00 40 mg via ORAL
  Filled 2014-02-03 (×2): qty 1

## 2014-02-03 MED ORDER — AMLODIPINE BESYLATE 5 MG PO TABS
5.0000 mg | ORAL_TABLET | Freq: Every day | ORAL | Status: DC
  Filled 2014-02-03: qty 1

## 2014-02-03 NOTE — CV Procedure (Signed)
Ana AliDiane T Edwards is a 58 y.o. female    [id number] LOCATION:  FACILITY: [facility]  PHYSICIAN: Nanetta BattyJonathan Berry, M.D. 03/26/1956   DATE OF PROCEDURE:  02/03/2014  DATE OF DISCHARGE:     PV Angiogram/Intervention    History obtained from chart review.Ms. Ana Edwards is a 58 year old moderately overweight divorced African American female mother of 4, grandmother scheduled and referred by Dr. Santiago Bumpersichard Tuchman for peripheral vascular evaluation because of life style  limiting claudication. Her risk factors include continued tobacco abuse and hypertension. She's not diabetic nor is a family history of heart disease. She has never had a heart attack or stroke patient denies chest pain or shortness of breath. 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 The patient relates Progressive symptoms over the last 6 months. Left is greater than right. She presents today for angiography and potential intervention. The Doppler suggests an occluded left SFA with high-grade right SFA disease.    PROCEDURE DESCRIPTION:   The patient was brought to the second floor [facility] cath lab in the postabsorptive state. She was premedicated with Valium 5 mg by mouth, IV fentanyl. Her left groinwas prepped and shaved in usual sterile fashion. Xylocaine 1% was used for local anesthesia. A 5 French sheath was inserted into the left common femoral artery using standard Seldinger technique. A 5 French pigtail catheter was placed in the distal abdominal aorta. Bilateral iliac angiography and bifemoral runoff using bolus chase, digital subtraction spelled table technique was used. Visipaque dye was used for the entirety of the case (310 cc administered the patient). Retrograde aortic pressure was monitored in the case.   HEMODYNAMICS:    AO SYSTOLIC/AO DIASTOLIC: 158/67   Angiographic Data:   1: Abdominal aorta-the distal abdominal aorta was free of  significant atherosclerotic changes  2: Left lower extremity-there is a segmental 40% stenosis throughout the majority of the left external iliac artery. The left SFA was occluded at its origin reconstituting in Hunter's canal by profunda femoris collaterals. There was three-vessel runoff.  3: Right lower extremity-there was a 50% stenosis in the right external iliac artery. The proximal and mid third of the right SFA had tandem 90% stenoses. The distal third of the SFA was 40-50% stenosed segmentally. The popliteal appeared free of significant disease. There was one vessel runoff via the peroneal. The anterior tibial dorsalis pedis filled by perineal collaterals  IMPRESSION:Ms. Ana Edwards has an occluded left SFA probably best treated by femoropopliteal bypass grafting. She has a diffusely diseased proximal mid right SFA with one-vessel runoff. We will proceed with diamondback orbital rotational atherectomy, PTA and stenting with Supera IDEV  stent for lifestyle limiting claudication.  Procedure Description:contralateral access obtained with a 5 French crossover catheter, 7 French/55 cm long Ansell sheath. The patient received a total of 11,000 units of heparin with an ending ACT of 215. The diseased segment was crossed with an 014 [place] per wire through a 018  Quick cross end hole  Catheter. This was then exchanged for a 014 viper wire. A 6 mm an NAV 6 distal protection device was in place in the above-the-knee popliteal artery over the guidewire.diamondback orbital rotational atherectomy was performed with a 2 mm solid there up to 90,000 RPMs throughout the entirety of the diseased segment. A 5 mm x 60 mmLutonix DEB was used to dilate the distal diseased segment with a suboptimal angiographic graphic result. Following this the  entire atherectomized segment was ballooned with a 6 mm x 150 mm balloon and stented with overlapping Supera  IDEV stents (5.5/150 mm, 5.5/100 mm). The final intercultural result with  reduction of the long 90% segmental proximal mid right SFA stenosis to 0% residual. Completion angiography was performed revealing intact perineal. The 7 French Ansell sheath was then withdrawn across the bifurcation and exchanged over an 0335 wire for a short 7 [place] sheath.  Final Impression: successful diamondback orbital rotational atherectomy, PTA and stenting using an IDEV Supera  Stent with an excellent angiographic result. The patient will be treated with double antibiotic therapy. She'll be hydrated overnight, discharged in the morning. We will obtain outpatient arterial Doppler studies in the [facility] office I will see her back after that.    Runell Gess MD, [facility] 02/03/2014 1:26 PM

## 2014-02-03 NOTE — Progress Notes (Addendum)
Site area: right groin  Site Prior to Removal:  Level 0  Pressure Applied For 20 MINUTES    Minutes Beginning at 1723  Manual:   yes  Patient Status During Pull:  stable  Post Pull Groin Site:  Level 0  Post Pull Instructions Given:  yes  Post Pull Pulses Present:  yes  Dressing Applied:  yes  Comments:  Gauze pressure dressing applied secured with medipore tape Rechecked site at 1800 and 1815 without change in assessment.

## 2014-02-03 NOTE — Interval H&P Note (Signed)
History and Physical Interval Note:  02/03/2014 11:05 AM  Ana Edwards  has presented today for surgery, with the diagnosis of Claudication  The various methods of treatment have been discussed with the patient and family. After consideration of risks, benefits and other options for treatment, the patient has consented to  Procedure(s): LOWER EXTREMITY ANGIOGRAM (N/A) as a surgical intervention .  The patient's history has been reviewed, patient examined, no change in status, stable for surgery.  I have reviewed the patient's chart and labs.  Questions were answered to the patient's satisfaction.     Runell GessBERRY,[name]

## 2014-02-03 NOTE — H&P (View-Only) (Signed)
01/24/2014 Ana Edwards   10/16/1956  [id number]  Primary Physician Ricki RodriguezKADAKIA,AJAY S, MD Primary Cardiologist: Runell GessJonathan J. Elo Marmolejos MD Roseanne RenoFACP,FACC,FAHA, FSCAI   HPI:  Ana Edwards is a 58 year old moderately overweight divorced African American female mother of 4, grandmother scheduled and referred by Dr. Santiago Bumpersichard Tuchman for peripheral vascular evaluation because of life limiting claudication. Her risk factors include continued tobacco abuse and hypertension. She's not diabetic nor is a family history of heart disease. She has never had a heart attack or stroke patient denies chest pain or shortness of breath. 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 The patient relates about code last 6 months. Left is greater than right.   Current Outpatient Prescriptions  Medication Sig Dispense Refill  . amLODipine (NORVASC) 5 MG tablet Take 5 mg by mouth daily.       . metoprolol (LOPRESSOR) 50 MG tablet Take 50 mg by mouth 2 (two) times daily.      Marland Kitchen. olmesartan (BENICAR) 20 MG tablet Take 10 mg by mouth daily.      Marland Kitchen. omeprazole (PRILOSEC) 20 MG capsule Take 20 mg by mouth daily as needed. Acid reflux       No current facility-administered medications for this visit.    Allergies  Allergen Reactions  . Tetracyclines & Related Nausea And Vomiting and Other (See Comments)    sweats    History   Social History  . Marital Status: Married    Spouse Name: N/A    Number of Children: N/A  . Years of Education: N/A   Occupational History  . Not on file.   Social History Main Topics  . Smoking status: Current Some Day Smoker -- 15 years  . Smokeless tobacco: Never Used     Comment: 1 pack per week  . Alcohol Use: No  . Drug Use: No  . Sexual Activity: Not on file   Other Topics Concern  . Not on file   Social History Narrative  . No narrative on file     Review of Systems: General: negative for chills,  fever, night sweats or weight changes.  Cardiovascular: negative for chest pain, dyspnea on exertion, edema, orthopnea, palpitations, paroxysmal nocturnal dyspnea or shortness of breath Dermatological: negative for rash Respiratory: negative for cough or wheezing Urologic: negative for hematuria Abdominal: negative for nausea, vomiting, diarrhea, bright red blood per rectum, melena, or hematemesis Neurologic: negative for visual changes, syncope, or dizziness All other systems reviewed and are otherwise negative except as noted above.    Blood pressure 138/70, pulse 68, height 5' 10.5" (1.791 m), weight 166 lb (75.297 kg).  General appearance: alert and no distress Neck: no adenopathy, no carotid bruit, no JVD, supple, symmetrical, trachea midline and thyroid not enlarged, symmetric, no tenderness/mass/nodules Lungs: clear to auscultation bilaterally Heart: regular rate and rhythm, S1, S2 normal, no murmur, click, rub or gallop Abdomen: soft, non-tender; bowel sounds normal; no masses,  no organomegaly Extremities: extremities normal, atraumatic, no cyanosis or edema and absent pedal pulses  EKG not performed today  ASSESSMENT AND PLAN:   Claudication The patient was referred by Dr. Durel Saltsichard Tuckman from [facility] for bilateral foot pain left greater than right as well as what sounds like claudication. Her risk factors include continued tobacco abuse and treated hypertension. She's never had a heart attack or stroke, but denies chest pain or shortness of breath.  Dopplers performed in the office showed multilevel of obstructive disease with moderately diminished ABIs. Admission she by angiography and potential percutaneous intervention for lifestyle limiting claudication.      Runell Gess MD FACP,FACC,FAHA, [facility] 01/24/2014 12:10 PM

## 2014-02-04 ENCOUNTER — Encounter (HOSPITAL_COMMUNITY): Payer: Self-pay | Admitting: Cardiology

## 2014-02-04 ENCOUNTER — Other Ambulatory Visit: Payer: Self-pay | Admitting: Cardiology

## 2014-02-04 DIAGNOSIS — N183 Chronic kidney disease, stage 3 unspecified: Secondary | ICD-10-CM | POA: Diagnosis present

## 2014-02-04 DIAGNOSIS — Z959 Presence of cardiac and vascular implant and graft, unspecified: Secondary | ICD-10-CM

## 2014-02-04 DIAGNOSIS — D649 Anemia, unspecified: Secondary | ICD-10-CM | POA: Diagnosis not present

## 2014-02-04 DIAGNOSIS — I739 Peripheral vascular disease, unspecified: Secondary | ICD-10-CM

## 2014-02-04 HISTORY — DX: Chronic kidney disease, stage 3 unspecified: N18.30

## 2014-02-04 LAB — BASIC METABOLIC PANEL
BUN: 14 mg/dL (ref 6–23)
CALCIUM: 7.6 mg/dL — AB (ref 8.4–10.5)
CO2: 21 meq/L (ref 19–32)
CREATININE: 1.32 mg/dL — AB (ref 0.50–1.10)
Chloride: 112 mEq/L (ref 96–112)
GFR calc Af Amer: 51 mL/min — ABNORMAL LOW (ref >90)
GFR calc non Af Amer: 44 mL/min — ABNORMAL LOW (ref >90)
Glucose, Bld: 100 mg/dL — ABNORMAL HIGH (ref 70–99)
Potassium: 3.6 mEq/L — ABNORMAL LOW (ref 3.7–5.3)
Sodium: 144 mEq/L (ref 137–147)

## 2014-02-04 LAB — CBC
HEMATOCRIT: 29.6 % — AB (ref 36.0–46.0)
Hemoglobin: 10 g/dL — ABNORMAL LOW (ref 12.0–15.0)
MCH: 27.9 pg (ref 26.0–34.0)
MCHC: 33.8 g/dL (ref 30.0–36.0)
MCV: 82.7 fL (ref 78.0–100.0)
PLATELETS: 182 10*3/uL (ref 150–400)
RBC: 3.58 MIL/uL — AB (ref 3.87–5.11)
RDW: 14.2 % (ref 11.5–15.5)
WBC: 6.7 10*3/uL (ref 4.0–10.5)

## 2014-02-04 MED ORDER — CLOPIDOGREL BISULFATE 75 MG PO TABS: 75.0000 mg | ORAL_TABLET | Freq: Every day | ORAL | Status: DC

## 2014-02-04 MED ORDER — CLOPIDOGREL BISULFATE 75 MG PO TABS
75.0000 mg | ORAL_TABLET | Freq: Every day | ORAL | Status: DC
  Administered 2014-02-04: 11:00:00 75 mg via ORAL
  Filled 2014-02-04: qty 1

## 2014-02-04 NOTE — Discharge Summary (Signed)
Physician Discharge Summary       Patient ID: Ana Edwards MRN: [id number] DOB/AGE: 59/02/1956 58 y.o.  Admit date: 02/03/2014 Discharge date: 02/04/2014  Discharge Diagnoses:  Principal Problem:   Claudication, lifestyle limiting Active Problems:   PAD (peripheral artery disease), 02/03/14 diamondback rotational atherectomy,PTA stent IDEV supera, mid-rt SFA.  Residual tot. L SFA    Hypertension   CKD (chronic kidney disease) stage 3, GFR 30-59 ml/min   Anemia   Discharged Condition: good  Procedures: PV angiogram by Dr. Allyson SabalBerry 02/03/14,  successful diamondback orbital rotational atherectomy, PTA and stenting using an IDEV Supera Stent with an excellent angiographic result 02/03/14.   Hospital Course:  58 year old moderately overweight divorced [place] female mother of 4, grandmother scheduled and referred by Dr. Santiago Bumpersichard Tuchman for peripheral vascular evaluation because of life style limiting claudication. Her risk factors include continued tobacco abuse and hypertension. She's not diabetic nor is a family history of heart disease. She has never had a heart attack or stroke patient denies chest pain or shortness of breath. 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 The patient relates Progressive symptoms over the last 6 months. Left is greater than right. She presents today for angiography and potential intervention. The Doppler suggests an occluded left SFA with high-grade right SFA disease.  She underwent elective PV angio:   Ms. Ana Edwards has an occluded left SFA probably best treated by femoropopliteal bypass grafting. She has a diffusely diseased proximal mid right SFA with one-vessel runoff. We will proceed with diamondback orbital rotational atherectomy, PTA and stenting with Supera IDEV stent for lifestyle limiting claudication Then underwent successful diamondback orbital rotational atherectomy,  PTA and stenting using an IDEV Supera Stent with an excellent angiographic result.  She did well and by the next AM no complaints.  She was seen and evaluated by Dr. Excell Seltzerooper and found stable for discharge.  She was counseled to stop smoking.  She will have post procedure LEA doppler on Rt then follow up with Dr. Allyson SabalBerry.   Consults: None  Significant Diagnostic Studies:  BMET    Component Value Date/Time   NA 144 02/04/2014 0550   K 3.6* 02/04/2014 0550   CL 112 02/04/2014 0550   CO2 21 02/04/2014 0550   GLUCOSE 100* 02/04/2014 0550   BUN 14 02/04/2014 0550   CREATININE 1.32* 02/04/2014 0550   CREATININE 1.31* 01/29/2014 0956   CALCIUM 7.6* 02/04/2014 0550   GFRNONAA 44* 02/04/2014 0550   GFRAA 51* 02/04/2014 0550    CBC    Component Value Date/Time   WBC 6.7 02/04/2014 0550   RBC 3.58* 02/04/2014 0550   HGB 10.0* 02/04/2014 0550   HCT 29.6* 02/04/2014 0550   PLT 182 02/04/2014 0550   MCV 82.7 02/04/2014 0550   MCH 27.9 02/04/2014 0550   MCHC 33.8 02/04/2014 0550   RDW 14.2 02/04/2014 0550   LYMPHSABS 2.7 01/09/2012 2255   MONOABS 0.7 01/09/2012 2255   EOSABS 0.2 01/09/2012 2255   BASOSABS 0.0 01/09/2012 2255       Discharge Exam: Blood pressure 132/60, pulse 61, temperature 98.7 F (37.1 C), temperature source Oral, resp. rate 18, height 5' 10.5" (1.791 m), weight 166 lb 14.2 oz (75.7 kg), SpO2 100.00%.    Disposition: 01-Home or Self Care     Medication List         amLODipine 5 MG tablet  Commonly known as:  NORVASC  Take 5 mg by mouth daily.     aspirin EC 81 MG tablet  Take 81 mg by mouth every other day.     clopidogrel 75 MG tablet  Commonly known as:  PLAVIX  Take 1 tablet (75 mg total) by mouth daily with breakfast.     metoprolol 50 MG tablet  Commonly known as:  LOPRESSOR  Take 25 mg by mouth 2 (two) times daily.     olmesartan 20 MG tablet  Commonly known as:  BENICAR  Take 10 mg by mouth daily.     omeprazole 20 MG capsule  Commonly known as:  PRILOSEC    Take 20 mg by mouth daily as needed. Acid reflux           Follow-up Information   Follow up with Runell Gess, MD. (our office will call with the date and time, you will be called for rt leg dopplers as well)    Specialty:  Cardiology   Contact information:   [address] [place] [address] [phone]        Discharge Instructions: Call [facility] at [phone] if any bleeding, swelling or drainage at cath site.  May shower, no tub baths for 48 hours for groin sticks.   No driving for 3 days  No lifting over 8 pounds for 3 days.  Heart Healthy diet   The Plavix is to keep your new stent open.  Keep up the good work of not smoking.  Signed: Leone Brand Nurse Practitioner-Certified [facility]: [facility] 02/04/2014, 9:49 AM  Time spent on discharge : >30 minutes.    See progress note. Agree as above.  Tonny Bollman 02/05/2014 10:54 AM

## 2014-02-04 NOTE — Discharge Instructions (Signed)
Call [facility] at [phone] if any bleeding, swelling or drainage at cath site.  May shower, no tub baths for 48 hours for groin sticks.   No driving for 3 days  No lifting over 8 pounds for 3 days.  Heart Healthy diet   The Plavix is to keep your new stent open.

## 2014-02-04 NOTE — Progress Notes (Signed)
         Subjective: No complaints, Rt leg feeling better  Objective: Vital signs in last 24 hours: Temp:  [97.3 F (36.3 C)-98.7 F (37.1 C)] 98.7 F (37.1 C) (02/17 0813) Pulse Rate:  [54-74] 61 (02/17 0813) Resp:  [16-20] 18 (02/17 0813) BP: (106-174)/(53-92) 132/60 mmHg (02/17 0813) SpO2:  [94 %-100 %] 100 % (02/17 0813) Weight:  [166 lb (75.297 kg)-166 lb 14.2 oz (75.7 kg)] 166 lb 14.2 oz (75.7 kg) (02/17 0017) Weight change:  Last BM Date: 02/03/14 Intake/Output from previous day: +537 02/16 0701 - 02/17 0700 In: 1487.5 [I.V.:1487.5] Out: 950 [Urine:950] Intake/Output this shift:    PE: General:Pleasant affect, NAD Skin:Warm and dry, brisk capillary refill HEENT:normocephalic, sclera clear, mucus membranes moist Heart:S1S2 RRR without murmur, gallup, rub or click Lungs:clear without rales, rhonchi, or wheezes ZOX:WRUEAbd:soft, non tender, + BS, do not palpate liver spleen or masses Ext:no lower ext edema, Lt groin without hematoma Neuro:alert and oriented, MAE, follows commands, + facial symmetry   Lab Results:  Recent Labs  02/04/14 0550  WBC 6.7  HGB 10.0*  HCT 29.6*  PLT 182   BMET  Recent Labs  02/04/14 0550  NA 144  K 3.6*  CL 112  CO2 21  GLUCOSE 100*  BUN 14  CREATININE 1.32*  CALCIUM 7.6*   No results found for this basename: TROPONINI, CK, MB,  in the last 72 hours  Lab Results  Component Value Date   CHOL 197 01/10/2012   HDL 50 01/10/2012   LDLCALC 134* 01/10/2012   TRIG 64 01/10/2012   CHOLHDL 3.9 01/10/2012   No results found for this basename: HGBA1C     Lab Results  Component Value Date   TSH 2.566 01/29/2014       Studies/Results: successful diamondback orbital rotational atherectomy, PTA and stenting using an IDEV Supera Stent with an excellent angiographic result  Medications: I have reviewed the patient's current medications. Scheduled Meds: . amLODipine  5 mg Oral Daily  . [START ON 02/05/2014] aspirin EC  81 mg Oral  QODAY  . irbesartan  150 mg Oral Daily  . metoprolol  25 mg Oral BID  . pantoprazole  40 mg Oral Daily   Continuous Infusions:  PRN Meds:.morphine injection  Assessment/Plan: Principal Problem:   Claudication, lifestyle limiting Active Problems:   PAD (peripheral artery disease), 02/03/14 diamondback rotational atherectomy,PTA stent IDEV supera, mid-rt SFA.  Residual tot. L SFA    Hypertension   CKD (chronic kidney disease) stage 3, GFR 30-59 ml/min   Anemia  PLAN:  We will obtain outpatient arterial Doppler studies in the [facility] office I will see her back after that. H/H drop post procedure.  Recheck H/H as outpt.  Ambulate then d/c home.   LOS: 1 day   Time spent with pt. : 15 minutes. [facility] AssociationNGOLD,LAURA R  Nurse Practitioner Certified Pager [phone] or after 5pm and on weekends call [phone] 02/04/2014, 8:48 AM   Patient seen, examined. Available data reviewed. Agree with findings, assessment, and plan as outlined by Nada BoozerLaura Ingold, NP. Left groin site is clear. Right foot is warm with a palpable DP pulse. Heart RRR and lungs clear. Discussed need for plavix and will write Rx for 75 mg daily. Follow-up Dr Allyson SabalBerry as planned.  Tonny BollmanMichael Marquelle Balow, M.D. 02/04/2014 9:18 AM

## 2014-02-06 ENCOUNTER — Telehealth (HOSPITAL_COMMUNITY): Payer: Self-pay | Admitting: *Deleted

## 2014-02-20 ENCOUNTER — Ambulatory Visit (HOSPITAL_COMMUNITY)
Admission: RE | Admit: 2014-02-20 | Discharge: 2014-02-20 | Disposition: A | Discharge status: Home / Self Care | Payer: BC Managed Care – PPO | Source: Ambulatory Visit | Attending: Cardiology | Admitting: Cardiology

## 2014-02-20 DIAGNOSIS — I739 Peripheral vascular disease, unspecified: Secondary | ICD-10-CM | POA: Insufficient documentation

## 2014-02-20 DIAGNOSIS — Z959 Presence of cardiac and vascular implant and graft, unspecified: Secondary | ICD-10-CM

## 2014-02-20 NOTE — Progress Notes (Signed)
Right Lower Extremity Arterial Duplex Completed. °Brianna L Mazza,RVT °

## 2014-02-27 ENCOUNTER — Encounter: Payer: Self-pay | Admitting: Cardiology

## 2014-02-27 ENCOUNTER — Ambulatory Visit (INDEPENDENT_AMBULATORY_CARE_PROVIDER_SITE_OTHER): Payer: BC Managed Care – PPO | Admitting: Cardiology

## 2014-02-27 VITALS — BP 142/76 | HR 60 | Ht 70.0 in | Wt 171.0 lb

## 2014-02-27 DIAGNOSIS — I10 Essential (primary) hypertension: Secondary | ICD-10-CM

## 2014-02-27 DIAGNOSIS — I739 Peripheral vascular disease, unspecified: Secondary | ICD-10-CM

## 2014-02-27 NOTE — Patient Instructions (Signed)
We will call results of your rt leg doppler, for your left leg and possible surgery, I will contact Dr. Allyson SabalBerry and Have your see him or the surgical team to help reduce the pain int her Left leg.  Congrats on stopping tobacco.  Eat heart healthy

## 2014-02-27 NOTE — Progress Notes (Signed)
03/03/2014   PCP: Ricki RodriguezKADAKIA,AJAY S, MD   Chief Complaint  Patient presents with  . Follow-up    S/P stent placement    Primary Cardiologist: Dr. Allyson SabalBerry  HPI:  58 year old moderately overweight divorced African American female mother of 4, grandmother scheduled and referred by Dr. Santiago Bumpersichard Tuchman for peripheral vascular evaluation because of life limiting claudication. Her risk factors include continued tobacco abuse and hypertension. She's not diabetic nor is a family history of heart disease. She has never had a heart attack or stroke, patient denies chest pain or shortness of breath. 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 The patient relates about claudication last 6 months. Left is greater than right.  Since first visit she has had successful diamondback orbital rotational atherectomy, PTA and stenting using an IDEV Supera stent. Post doppler of rt leg was excellent.  No further claudication on Rt.  Lt leg with claudication and need for bypass.    Pt congratulated on stopping tobacco since last visit.  She has no complaints.  We did discuss need for surgery on lt leg.  She will see Dr. Allyson SabalBerry back for recommendations and consult.    Allergies  Allergen Reactions  . Tetracyclines & Related Nausea And Vomiting and Other (See Comments)    sweats    Current Outpatient Prescriptions  Medication Sig Dispense Refill  . amLODipine (NORVASC) 5 MG tablet Take 5 mg by mouth daily.       Marland Kitchen. aspirin EC 81 MG tablet Take 81 mg by mouth every other day.      . clopidogrel (PLAVIX) 75 MG tablet Take 1 tablet (75 mg total) by mouth daily with breakfast.  30 tablet  11  . metoprolol (LOPRESSOR) 50 MG tablet Take 25 mg by mouth 2 (two) times daily.       Marland Kitchen. olmesartan (BENICAR) 20 MG tablet Take 10 mg by mouth daily.      Marland Kitchen. omeprazole (PRILOSEC) 20 MG capsule Take 20 mg by mouth daily as needed. Acid reflux        No current facility-administered medications for this visit.    Past Medical History  Diagnosis Date  . GERD (gastroesophageal reflux disease)   . Hypertension   . Tobacco abuse   . Claudication   . PAD (peripheral artery disease)   . Arthritis     "legs sometimes" (02/03/2014)  . CKD (chronic kidney disease) stage 3, GFR 30-59 ml/min 02/04/2014    Past Surgical History  Procedure Laterality Date  . Peripheral athrectomy Right 02/03/2014    w/stent SFA    ZOX:WRUEAVW:UJROS:General:no colds or fevers, no weight changes Skin:no rashes or ulcers HEENT:no blurred vision, no congestion CV:see HPI PUL:see HPI- has stopped smoking GI:no diarrhea constipation or melena, no indigestion GU:no hematuria, no dysuria MS:no joint pain, no claudication Neuro:no syncope, no lightheadedness Endo:no diabetes, no thyroid disease  PHYSICAL EXAM BP 142/76  Pulse 60  Ht 5\' 10"  (1.778 m)  Wt 171 lb (77.565 kg)  BMI 24.54 kg/m2 General:Pleasant affect, NAD Skin:Warm and dry, brisk capillary refill HEENT:normocephalic, sclera clear, mucus membranes moist Neck:supple, no JVD, no bruits  Heart:S1S2 RRR without murmur, gallup, rub or click Lungs:clear without rales, rhonchi, or wheezes WJX:BJYNAbd:soft, non tender, + BS, do not palpate liver spleen or masses Ext:no lower ext edema, 2+ pedal pulse on Rt, 2+ radial pulses Neuro:alert and  oriented, MAE, follows commands, + facial symmetry   ASSESSMENT AND PLAN PAD (peripheral artery disease), 02/03/14 diamondback rotational atherectomy,PTA stent IDEV supera, mid-rt SFA.  Residual tot. L SFA  S/p 02/03/14 diamondback rotational atherectomy,PTA stent IDEV supera, mid-rt SFA.  Residual tot. L SFA - with need for fem pop bypass.  Doppler post stent with Rt SFA stent normal patency with no evidence for significant diameter reduction.  Pt will follow up with Dr. Allyson Sabal in 4 weeks and discuss surgery.  Claudication, lifestyle limiting No further Rt leg claudication, + lt  leg, no desire for surgery this month.  Hypertension stable

## 2014-03-03 ENCOUNTER — Encounter: Payer: Self-pay | Admitting: Cardiology

## 2014-03-03 NOTE — Assessment & Plan Note (Signed)
No further Rt leg claudication, + lt leg, no desire for surgery this month.

## 2014-03-03 NOTE — Progress Notes (Signed)
Pt. Called and message left to call me back about her doppler results and appt. With Dr. Allyson SabalBerry in 4 weeks

## 2014-03-03 NOTE — Assessment & Plan Note (Addendum)
S/p 02/03/14 diamondback rotational atherectomy,PTA stent IDEV supera, mid-rt SFA.  Residual tot. L SFA - with need for fem pop bypass.  Doppler post stent with Rt SFA stent normal patency with no evidence for significant diameter reduction.  Pt will follow up with Dr. Allyson Sabal in 4 weeks and discuss surgery.

## 2014-03-03 NOTE — Assessment & Plan Note (Signed)
stable °

## 2014-08-19 ENCOUNTER — Telehealth (HOSPITAL_COMMUNITY): Payer: Self-pay | Admitting: *Deleted

## 2014-10-03 ENCOUNTER — Other Ambulatory Visit: Payer: Self-pay

## 2014-11-27 ENCOUNTER — Encounter (HOSPITAL_COMMUNITY): Payer: Self-pay | Admitting: Cardiovascular Disease

## 2015-01-30 ENCOUNTER — Ambulatory Visit: Payer: Self-pay | Admitting: Cardiovascular Disease

## 2015-02-16 ENCOUNTER — Other Ambulatory Visit: Payer: Self-pay | Admitting: Cardiology

## 2015-02-16 NOTE — Telephone Encounter (Signed)
Rx has been sent to the pharmacy electronically. ° °

## 2015-03-03 ENCOUNTER — Telehealth (HOSPITAL_COMMUNITY): Payer: Self-pay | Admitting: Cardiovascular Disease

## 2015-03-03 NOTE — Telephone Encounter (Signed)
Received records from Dr Billee Cashing for appointment on 03/10/15 with Dr Allyson Sabal.  Records given to [facility] (medical records) for Dr Hazle Coca schedule on 03/10/15.  lp

## 2015-03-10 ENCOUNTER — Encounter: Payer: Self-pay | Admitting: Cardiovascular Disease

## 2015-03-10 ENCOUNTER — Ambulatory Visit (INDEPENDENT_AMBULATORY_CARE_PROVIDER_SITE_OTHER): Payer: 59 | Admitting: Cardiovascular Disease

## 2015-03-10 VITALS — BP 114/70 | HR 58 | Ht 70.5 in | Wt 170.9 lb

## 2015-03-10 DIAGNOSIS — E785 Hyperlipidemia, unspecified: Secondary | ICD-10-CM

## 2015-03-10 DIAGNOSIS — I739 Peripheral vascular disease, unspecified: Secondary | ICD-10-CM

## 2015-03-10 DIAGNOSIS — I10 Essential (primary) hypertension: Secondary | ICD-10-CM

## 2015-03-10 DIAGNOSIS — Z79899 Other long term (current) drug therapy: Secondary | ICD-10-CM

## 2015-03-10 NOTE — Progress Notes (Signed)
03/10/2015 Ana Edwards   06/16/1956  [id number]  Primary Physician Billee Cashing, MD Primary Cardiologist: Runell Gess MD Roseanne Reno   HPI:  Ana Edwards is a 59 year old moderately overweight divorced African American female mother of 4, grandmother who  was referred by Dr. Santiago Bumpers for peripheral vascular evaluation because of life style limiting claudication. Her risk factors include discontinued tobacco abuse and hypertension. She's not diabetic nor is a family history of heart disease. She has never had a heart attack or stroke patient denies chest pain or shortness of breath. 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 The patient relates Progressive symptoms over the last 6 months. Left is greater than right. She presents today for angiography and potential intervention. The Doppler suggests an occluded left SFA with high-grade right SFA disease. I angiogram to her on 02/03/14 revealing an occluded left SFA at its origin reconstituting in the abductor canal with high-grade calcified right SFA stenosis. I ultimately performed diamondback orbital rotational atherectomy, PTCA and stenting using an IDET V stent resulting in excellent and clinical result. She had 1 vessel runoff below the knee the peroneal artery.Her ABI increased from 0.69 to .76 with resolution of her claudication on the right. She continues to have left lower extremity claudication.   Current Outpatient Prescriptions  Medication Sig Dispense Refill  . amLODipine (NORVASC) 5 MG tablet Take 5 mg by mouth daily.     Marland Kitchen aspirin EC 81 MG tablet Take 81 mg by mouth every other day.    . clopidogrel (PLAVIX) 75 MG tablet TAKE 1 TABLET BY MOUTH DAILY WITH BREAKFAST. 30 tablet 0  . losartan-hydrochlorothiazide (HYZAAR) 100-12.5 MG per tablet Take 1 tablet by mouth daily.  3  . metoprolol (LOPRESSOR) 50 MG tablet Take 25 mg by  mouth 2 (two) times daily.     Marland Kitchen omeprazole (PRILOSEC) 20 MG capsule Take 20 mg by mouth daily as needed. Acid reflux     No current facility-administered medications for this visit.    Allergies  Allergen Reactions  . Tetracyclines & Related Nausea And Vomiting and Other (See Comments)    sweats    History   Social History  . Marital Status: Single    Spouse Name: N/A  . Number of Children: N/A  . Years of Education: N/A   Occupational History  . Not on file.   Social History Main Topics  . Smoking status: Current Some Day Smoker -- 0.12 packs/day for 18 years    Types: Cigarettes  . Smokeless tobacco: Never Used     Comment: 1 pack per week  . Alcohol Use: No  . Drug Use: No  . Sexual Activity: Not Currently   Other Topics Concern  . Not on file   Social History Narrative     Review of Systems: General: negative for chills, fever, night sweats or weight changes.  Cardiovascular: negative for chest pain, dyspnea on exertion, edema, orthopnea, palpitations, paroxysmal nocturnal dyspnea or shortness of breath Dermatological: negative for rash Respiratory: negative for cough or wheezing Urologic: negative for hematuria Abdominal: negative for nausea, vomiting, diarrhea, bright red blood per rectum, melena, or hematemesis Neurologic: negative for visual changes, syncope, or dizziness All other systems reviewed and are otherwise negative except as noted above.    Blood pressure 114/70, pulse 58, height 5' 10.5" (1.791 m), weight 170 lb 14.4 oz (77.52  kg).  General appearance: alert and no distress Neck: no adenopathy, no carotid bruit, no JVD, supple, symmetrical, trachea midline and thyroid not enlarged, symmetric, no tenderness/mass/nodules Lungs: clear to auscultation bilaterally Heart: regular rate and rhythm, S1, S2 normal, no murmur, click, rub or gallop Extremities: extremities normal, atraumatic, no cyanosis or edema  EKG sinus bradycardia 58 without ST or  T-wave changes. I personally reviewed this EKG  ASSESSMENT AND PLAN:   PAD (peripheral artery disease), 02/03/14 diamondback rotational atherectomy,PTA stent IDEV supera, mid-rt SFA.  Residual tot. L SFA  History of peripheral arterial disease with high-grade symptomatically SFA disease status post high-speed rotational atherectomy, PTA and stenting using IDEV stent of the right SFA 02/03/14. There was only 1 vessel runoff via the peroneal.the left SFA was occluded at its origin reconstituting in Hunter's canal. Her postprocedure Dopplers improved from an ABI of 0.692 ABI 0.76. Her claudication of the right resolve as well. She continues to complain of left lower extremity claudication though the only way to revascularize it would be left femoropopliteal bypass grafting. Repeat lower extremity arterial Doppler studies. At this point she does not wish to proceed with surgical revascularization.   Hypertension History of hypertension blood pressure measured at 114/70. She is on amlodipine, metoprolol, and Hyzaar. Continue current meds at current dosing   Hyperlipidemia History of hyperlipidemia with her last lipid profile performed 01/10/12 revealed a total cholesterol 197 with LDL 134 and HDL 50. She is not on a statin drug. I will recheck a lipid and liver profile       Runell Gess MD [facility], [facility] 03/10/2015 9:19 AM

## 2015-03-10 NOTE — Assessment & Plan Note (Signed)
History of hyperlipidemia with her last lipid profile performed 01/10/12 revealed a total cholesterol 197 with LDL 134 and HDL 50. She is not on a statin drug. I will recheck a lipid and liver profile

## 2015-03-10 NOTE — Assessment & Plan Note (Signed)
History of hypertension blood pressure measured at 114/70. She is on amlodipine, metoprolol, and Hyzaar. Continue current meds at current dosing

## 2015-03-10 NOTE — Assessment & Plan Note (Signed)
History of peripheral arterial disease with high-grade symptomatically SFA disease status post high-speed rotational atherectomy, PTA and stenting using IDEV stent of the right SFA 02/03/14. There was only 1 vessel runoff via the peroneal.the left SFA was occluded at its origin reconstituting in Hunter's canal. Her postprocedure Dopplers improved from an ABI of 0.692 ABI 0.76. Her claudication of the right resolve as well. She continues to complain of left lower extremity claudication though the only way to revascularize it would be left femoropopliteal bypass grafting. Repeat lower extremity arterial Doppler studies. At this point she does not wish to proceed with surgical revascularization.

## 2015-03-10 NOTE — Patient Instructions (Signed)
  We will see you back in follow up in 1 year with Dr Allyson Sabal.  Dr Allyson Sabal has ordered: 1. lower extremity arterial doppler- During this test, ultrasound is used to evaluate arterial blood flow in the legs. Allow approximately one hour for this exam.   2. A FASTING lipid profile: to be done at your convenience.  There is a [facility] lab on the first floor of this building, [address].  They are open from 8am-5pm with a lunch from 12-2.  You do not need an appointment.

## 2015-03-13 ENCOUNTER — Other Ambulatory Visit: Payer: Self-pay | Admitting: Cardiovascular Disease

## 2015-03-13 NOTE — Telephone Encounter (Signed)
Rx refill sent to patient pharmacy   

## 2015-03-24 ENCOUNTER — Encounter (HOSPITAL_COMMUNITY): Payer: 59

## 2015-03-31 LAB — HEPATIC FUNCTION PANEL
ALBUMIN: 3.8 g/dL (ref 3.5–5.2)
ALK PHOS: 70 U/L (ref 39–117)
ALT: 8 U/L (ref 0–35)
AST: 14 U/L (ref 0–37)
Bilirubin, Direct: 0.1 mg/dL (ref 0.0–0.3)
Indirect Bilirubin: 0.3 mg/dL (ref 0.2–1.2)
TOTAL PROTEIN: 6.5 g/dL (ref 6.0–8.3)
Total Bilirubin: 0.4 mg/dL (ref 0.2–1.2)

## 2015-03-31 LAB — LIPID PANEL
Cholesterol: 196 mg/dL (ref 0–200)
HDL: 44 mg/dL — ABNORMAL LOW (ref >=46)
LDL CALC: 132 mg/dL — AB (ref 0–99)
Total CHOL/HDL Ratio: 4.5 Ratio
Triglycerides: 102 mg/dL (ref <150)
VLDL: 20 mg/dL (ref 0–40)

## 2015-04-02 ENCOUNTER — Encounter: Payer: Self-pay | Admitting: *Deleted

## 2015-04-02 ENCOUNTER — Encounter (HOSPITAL_COMMUNITY): Payer: 59

## 2015-04-02 ENCOUNTER — Telehealth: Payer: Self-pay | Admitting: *Deleted

## 2015-04-02 DIAGNOSIS — E785 Hyperlipidemia, unspecified: Secondary | ICD-10-CM

## 2015-04-02 DIAGNOSIS — Z79899 Other long term (current) drug therapy: Secondary | ICD-10-CM

## 2015-04-02 MED ORDER — ATORVASTATIN CALCIUM 40 MG PO TABS: 40.0000 mg | ORAL_TABLET | Freq: Every day | ORAL | Status: DC

## 2015-04-02 NOTE — Telephone Encounter (Signed)
-----   Message from Runell Gess, MD sent at 04/01/2015  5:04 AM EDT ----- Start lipitor 40 mg and re check

## 2015-04-02 NOTE — Telephone Encounter (Signed)
Letter with results was mailed to patient.  ErX sent to pharmacy  Repeat lab slip mailed to patient.

## 2015-04-07 ENCOUNTER — Ambulatory Visit (HOSPITAL_COMMUNITY)
Admission: RE | Admit: 2015-04-07 | Discharge: 2015-04-07 | Disposition: A | Discharge status: Home / Self Care | Payer: 59 | Source: Ambulatory Visit | Attending: Cardiovascular Disease | Admitting: Cardiovascular Disease

## 2015-04-07 DIAGNOSIS — I739 Peripheral vascular disease, unspecified: Secondary | ICD-10-CM | POA: Diagnosis not present

## 2015-04-07 NOTE — Progress Notes (Signed)
Lower extremity arterial duplex completed. °Brianna L Mazza,RVT °

## 2015-04-17 ENCOUNTER — Ambulatory Visit (INDEPENDENT_AMBULATORY_CARE_PROVIDER_SITE_OTHER): Payer: 59 | Admitting: Cardiovascular Disease

## 2015-04-17 ENCOUNTER — Encounter: Payer: Self-pay | Admitting: Cardiovascular Disease

## 2015-04-17 ENCOUNTER — Ambulatory Visit: Payer: 59 | Admitting: Cardiovascular Disease

## 2015-04-17 VITALS — BP 164/98 | HR 64 | Ht 70.5 in | Wt 175.1 lb

## 2015-04-17 DIAGNOSIS — I739 Peripheral vascular disease, unspecified: Secondary | ICD-10-CM

## 2015-04-17 DIAGNOSIS — E785 Hyperlipidemia, unspecified: Secondary | ICD-10-CM | POA: Diagnosis not present

## 2015-04-17 DIAGNOSIS — Z79899 Other long term (current) drug therapy: Secondary | ICD-10-CM

## 2015-04-17 NOTE — Assessment & Plan Note (Signed)
History of hyperlipidemia. Lipid profile performed 03/30/15. Total cholesterol 196, LDL 132 and HDL 44. She was recently begun on atorvastatin. We will recheck a lipid and liver profile.

## 2015-04-17 NOTE — Patient Instructions (Signed)
  We will see you back in follow up in 6 months with Dr Allyson Sabal.   Dr Allyson Sabal has ordered: A FASTING lipid profile: to be done in 2 months. There is a [facility] lab on the first floor of this building, [address].  They are open from 8am-5pm with a lunch from 12-2.  You do not need an appointment.

## 2015-04-17 NOTE — Assessment & Plan Note (Signed)
Ana Edwards returns today for review of her lower extremity arterial Doppler studies performed 04/07/15. She did have diamondback orbital rotation atherectomy of her calcified right SFA 02/03/14 with stenting using an IDEV stent. When I saw her on 03/10/15 she had mentioned left lower extremity claudication and Dopplers performed on 04/07/15 revealed a right ABI 0.9 with a patent stent and a left ABI 0.63 with high-grade left common and external iliac artery stenosis as well as the left SFA. I reviewed her angiogram which showed a flush occlusion of her left SFA at its origin suggesting that the only method of revascularization would be sent to above the knee popliteal bypass grafting. She does have 3 vessel runoff on that side. After further discussion today she really denies left lower extremity claudication and therefore I suggest conservative medical therapy at this time.

## 2015-04-17 NOTE — Progress Notes (Signed)
Ana Edwards returns today for review of her lower extremity arterial Doppler studies performed 04/07/15. She did have diamondback orbital rotation atherectomy of her calcified right SFA 02/03/14 with stenting using an IDEV stent. When I saw her on 03/10/15 she had mentioned left lower extremity claudication and Dopplers performed on 04/07/15 revealed a right ABI 0.9 with a patent stent and a left ABI 0.63 with high-grade left common and external iliac artery stenosis as well as the left SFA. I reviewed her angiogram which showed a flush occlusion of her left SFA at its origin suggesting that the only method of revascularization would be sent to above the knee popliteal bypass grafting. She does have 3 vessel runoff on that side. After further discussion today she really denies left lower extremity claudication and therefore I suggest conservative medical therapy at this time.   Runell Gess, M.D., FACP, [facility], [facility] [address] [place], [place]  [address]  [phone] 04/17/2015 8:40 AM

## 2015-09-14 IMAGING — CR DG CHEST 2V
2 series · 2 of 2 positions shown · non-contrast
Comparison: 01/09/2012

CLINICAL DATA: Claudication

EXAM:
CHEST  2 VIEW

[w chest pa]
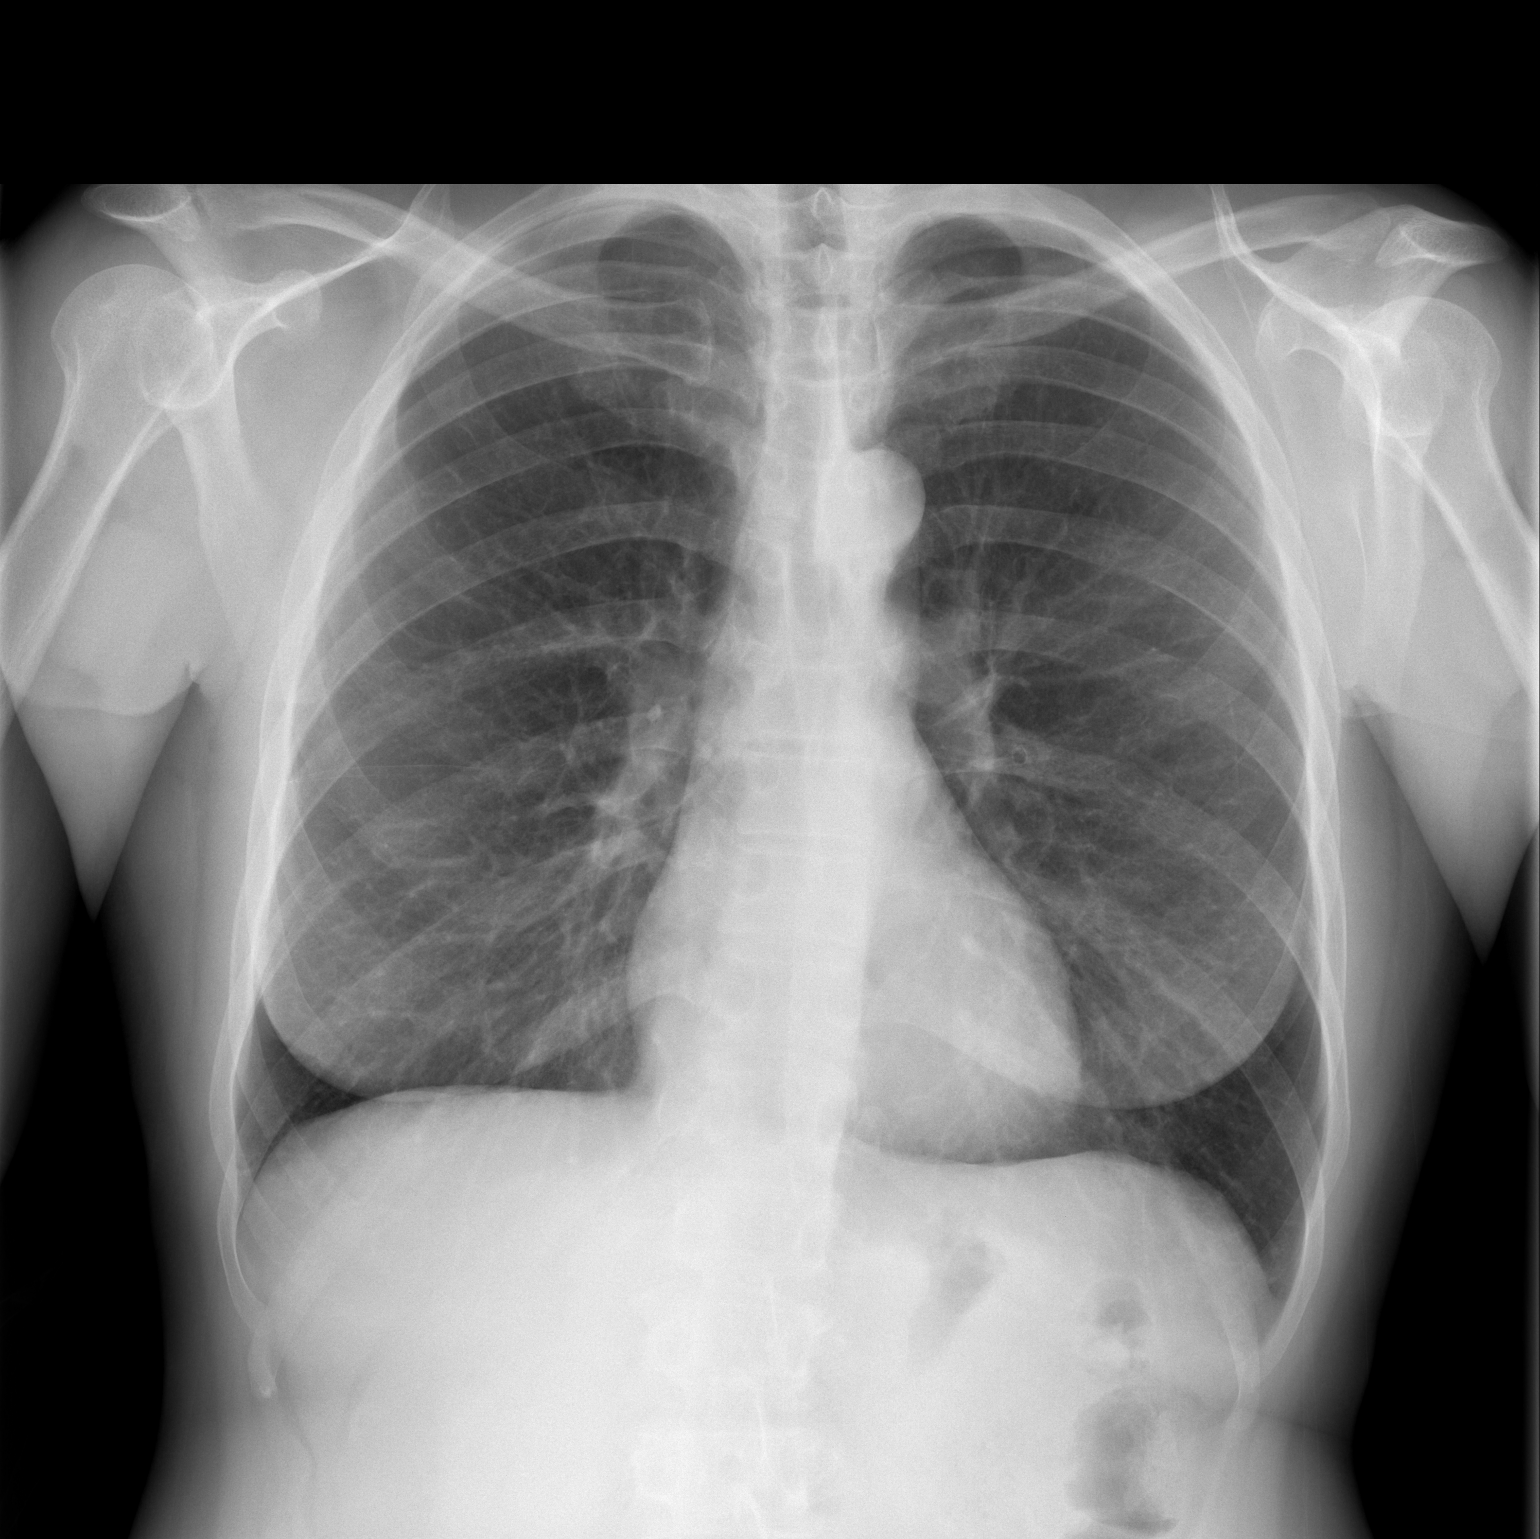

[w chest lat]
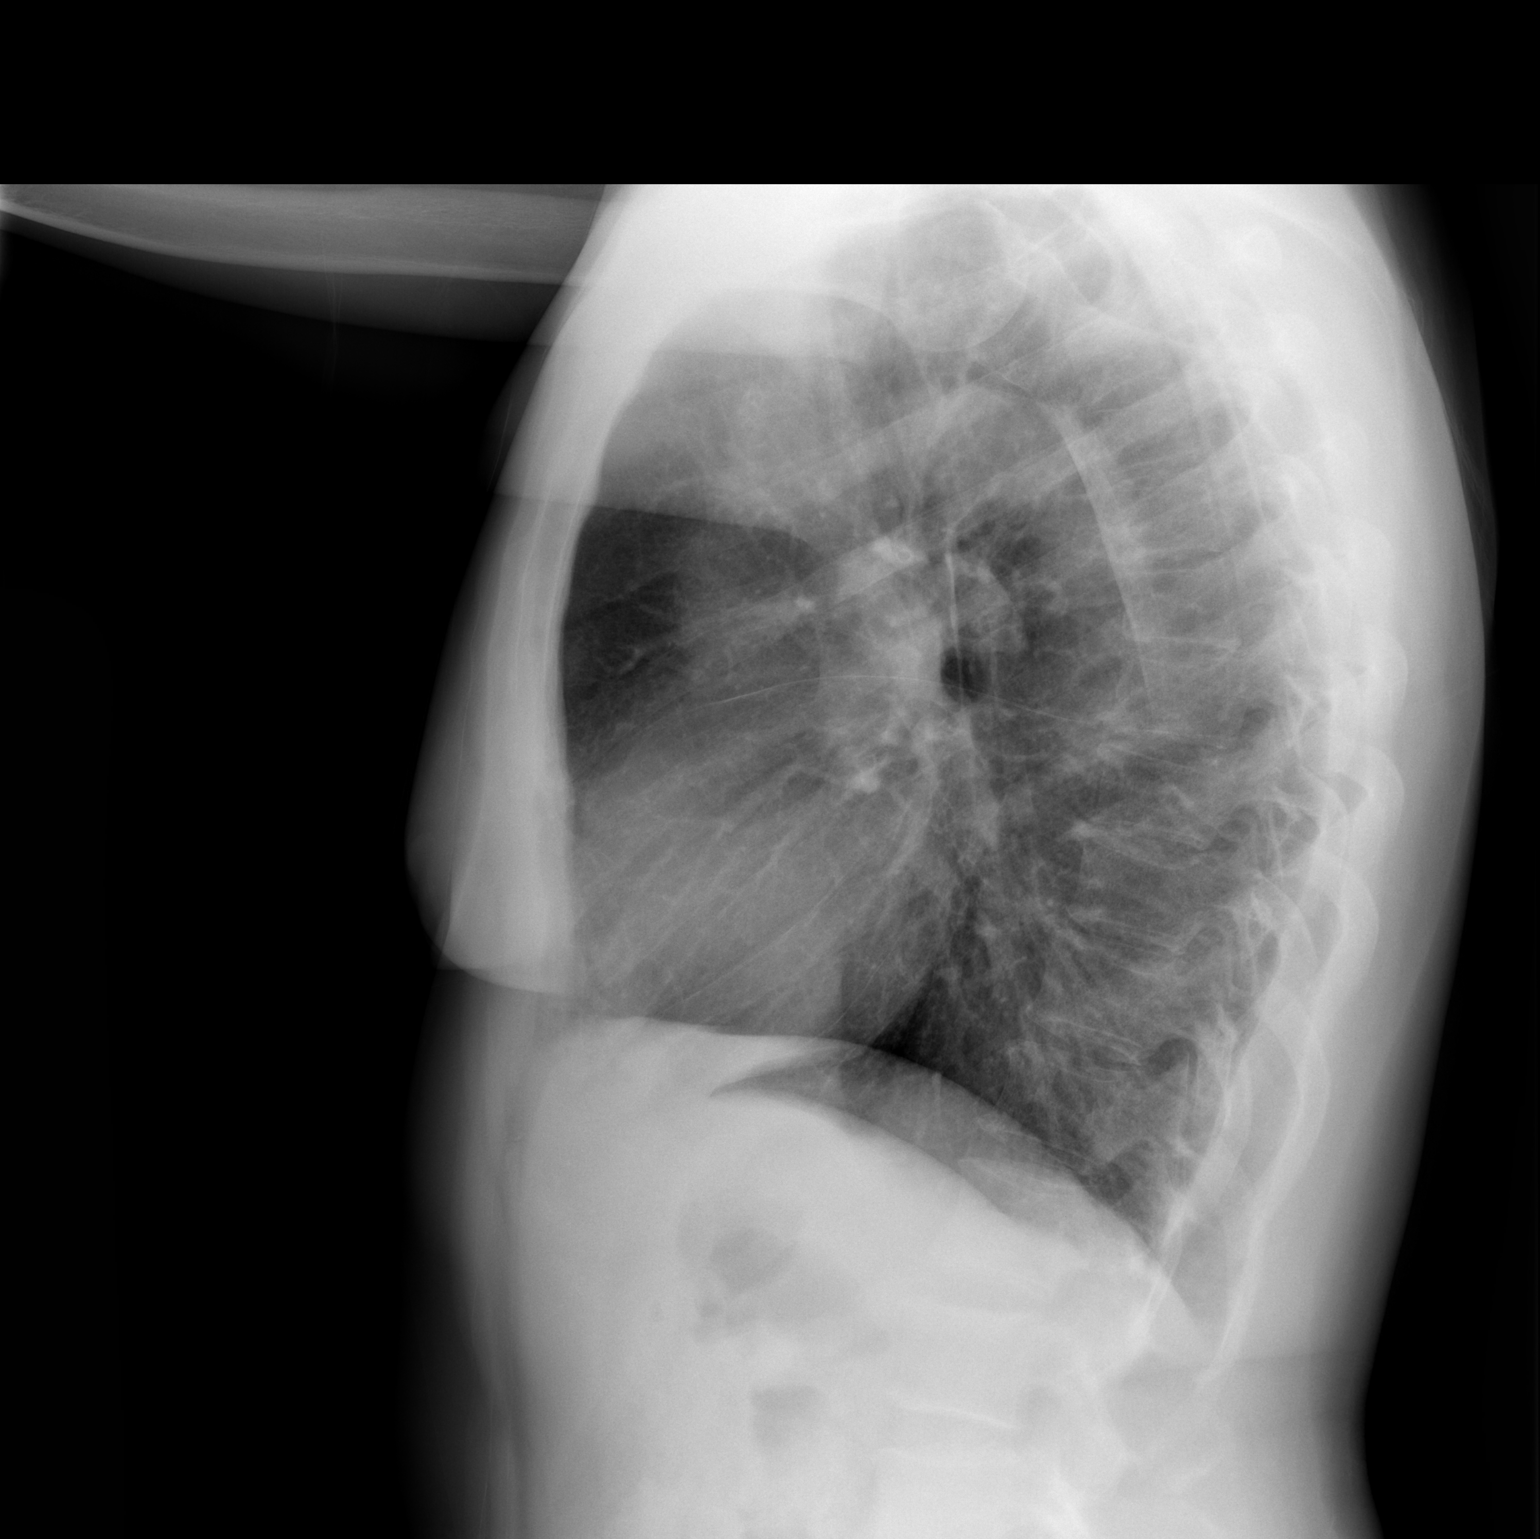

[2 of 2 positions shown; findings below may reference images not displayed]

FINDINGS: Cardiomediastinal silhouette is unremarkable. No acute infiltrate or
pleural effusion. No pulmonary edema. Mild degenerative changes
thoracic spine.
IMPRESSION: No active cardiopulmonary disease.

## 2016-03-18 ENCOUNTER — Encounter: Payer: Self-pay | Admitting: Nurse Practitioner

## 2016-03-18 ENCOUNTER — Ambulatory Visit (INDEPENDENT_AMBULATORY_CARE_PROVIDER_SITE_OTHER): Payer: BLUE CROSS/BLUE SHIELD | Admitting: Nurse Practitioner

## 2016-03-18 VITALS — BP 152/78 | HR 64 | Ht 70.5 in | Wt 170.8 lb

## 2016-03-18 DIAGNOSIS — E785 Hyperlipidemia, unspecified: Secondary | ICD-10-CM | POA: Diagnosis not present

## 2016-03-18 DIAGNOSIS — I10 Essential (primary) hypertension: Secondary | ICD-10-CM

## 2016-03-18 DIAGNOSIS — I739 Peripheral vascular disease, unspecified: Secondary | ICD-10-CM | POA: Diagnosis not present

## 2016-03-18 LAB — BASIC METABOLIC PANEL
BUN: 14 mg/dL (ref 7–25)
CO2: 26 mmol/L (ref 20–31)
Calcium: 9 mg/dL (ref 8.6–10.4)
Chloride: 106 mmol/L (ref 98–110)
Creat: 1.26 mg/dL — ABNORMAL HIGH (ref 0.50–1.05)
Glucose, Bld: 90 mg/dL (ref 65–99)
Potassium: 4 mmol/L (ref 3.5–5.3)
Sodium: 140 mmol/L (ref 135–146)

## 2016-03-18 LAB — HEPATIC FUNCTION PANEL
ALT: 7 U/L (ref 6–29)
AST: 12 U/L (ref 10–35)
Albumin: 3.8 g/dL (ref 3.6–5.1)
Alkaline Phosphatase: 70 U/L (ref 33–130)
Bilirubin, Direct: 0.1 mg/dL (ref <=0.2)
Indirect Bilirubin: 0.2 mg/dL (ref 0.2–1.2)
Total Bilirubin: 0.3 mg/dL (ref 0.2–1.2)
Total Protein: 6.4 g/dL (ref 6.1–8.1)

## 2016-03-18 LAB — LIPID PANEL
Cholesterol: 147 mg/dL (ref 125–200)
HDL: 41 mg/dL — ABNORMAL LOW (ref >=46)
LDL Cholesterol: 84 mg/dL (ref <130)
Total CHOL/HDL Ratio: 3.6 Ratio (ref <=5.0)
Triglycerides: 109 mg/dL (ref <150)
VLDL: 22 mg/dL (ref <30)

## 2016-03-18 LAB — CBC
HCT: 34.4 % — ABNORMAL LOW (ref 36.0–46.0)
Hemoglobin: 11.7 g/dL — ABNORMAL LOW (ref 12.0–15.0)
MCH: 28.1 pg (ref 26.0–34.0)
MCHC: 34 g/dL (ref 30.0–36.0)
MCV: 82.5 fL (ref 78.0–100.0)
MPV: 9.6 fL (ref 8.6–12.4)
Platelets: 321 10*3/uL (ref 150–400)
RBC: 4.17 MIL/uL (ref 3.87–5.11)
RDW: 14.9 % (ref 11.5–15.5)
WBC: 6.1 10*3/uL (ref 4.0–10.5)

## 2016-03-18 LAB — TSH: TSH: 1.83 mIU/L

## 2016-03-18 NOTE — Progress Notes (Signed)
CARDIOLOGY OFFICE NOTE  Date:  03/18/2016    Ana Edwards Date of Birth: 03-Jan-1956 Medical Record #[id number]  PCP:  Billee Cashing, MD  Cardiologist:  Allyson Sabal    Chief Complaint  Patient presents with  . Work in visit to discuss HR    Seen for Dr. Allyson Sabal    History of Present Illness: Ana Edwards is a 60 y.o. female who presents today for a work in visit. Seen for Dr. Allyson Sabal.   She has a history of PAD, HLD, GERD, tobacco abuse, HTN and CKD.   Last seen in April of 2016.  Comes in today. Here alone. She woke up yesterday and was dizzy. Felt like everything was spinning. Drove to work but could not get out of the car - then went to Urgent Care. BP was ok. Was not orthostatic. EKG done - but not available for me. Then felt better as the day went on. Feels better today. Was told that she should not be on all her medicines. Says her EKG was "ok" but apparently HR was 58 and was told it should be 60. Was told she had vertigo. Referred to the ER but she did not go. She typically does not get dizzy. She does not having any passing out spells. She feels fine today. BP typically lower at home. She has had a recent URI.   Past Medical History  Diagnosis Date  . GERD (gastroesophageal reflux disease)   . Hypertension   . Tobacco abuse     patient says she stopped smoking June 2015  . Claudication (HCC)   . PAD (peripheral artery disease) (HCC)   . Arthritis     "legs sometimes" (02/03/2014)  . CKD (chronic kidney disease) stage 3, GFR 30-59 ml/min 02/04/2014  . Hyperlipidemia     Past Surgical History  Procedure Laterality Date  . Peripheral athrectomy Right 02/03/2014    w/stent SFA  . Lower extremity angiogram N/A 02/03/2014    Procedure: LOWER EXTREMITY ANGIOGRAM;  Surgeon: Runell Gess, MD;  Location: [facility] C[facility] LAB;  Service: Cardiovascular;  Laterality: N/A;     Medications: Current Outpatient Prescriptions  Medication Sig Dispense Refill  . amLODipine  (NORVASC) 5 MG tablet Take 5 mg by mouth daily.   1  . aspirin EC 81 MG tablet Take 81 mg by mouth every other day.    Marland Kitchen atorvastatin (LIPITOR) 40 MG tablet Take 1 tablet (40 mg total) by mouth daily. 30 tablet 6  . clopidogrel (PLAVIX) 75 MG tablet TAKE 1 TABLET BY MOUTH DAILY WITH BREAKFAST. 90 tablet 3  . metoprolol (LOPRESSOR) 50 MG tablet Take 25 mg by mouth 2 (two) times daily.     Marland Kitchen losartan (COZAAR) 100 MG tablet Take 100 mg by mouth daily. Reported on 03/18/2016  3   No current facility-administered medications for this visit.    Allergies: Allergies  Allergen Reactions  . Tetracyclines & Related Nausea And Vomiting and Other (See Comments)    sweats    Social History: The patient  reports that she has been smoking Cigarettes.  She has a 2.16 pack-year smoking history. She has never used smokeless tobacco. She reports that she does not drink alcohol or use illicit drugs.   Family History: The patient's family history includes Heart disease in her mother.   Review of Systems: Please see the history of present illness.   Otherwise, the review of systems is positive for none.   All other systems  are reviewed and negative.   Physical Exam: VS:  BP 152/78 mmHg  Pulse 64  Ht 5' 10.5" (1.791 m)  Wt 170 lb 12.8 oz (77.474 kg)  BMI 24.15 kg/m2 .  BMI Body mass index is 24.15 kg/(m^2).  Wt Readings from Last 3 Encounters:  03/18/16 170 lb 12.8 oz (77.474 kg)  04/17/15 175 lb 1.6 oz (79.425 kg)  03/10/15 170 lb 14.4 oz (77.52 kg)    General: Pleasant. Well developed, well nourished and in no acute distress.  HEENT: Normal but with missing teeth. Neck: Supple, no JVD, carotid bruits, or masses noted.  Cardiac: Regular rate and rhythm. No murmurs, rubs, or gallops. No edema.  Respiratory:  Lungs are clear to auscultation bilaterally with normal work of breathing.  GI: Soft and nontender.  MS: No deformity or atrophy. Gait and ROM intact. Skin: Warm and dry. Color is normal.    Neuro:  Strength and sensation are intact and no gross focal deficits noted.  Psych: Alert, appropriate and with normal affect.   LABORATORY DATA:  EKG:  EKG is not ordered today. Trying to get EKG from the Urgent Care.   Lab Results  Component Value Date   WBC 6.7 02/04/2014   HGB 10.0* 02/04/2014   HCT 29.6* 02/04/2014   PLT 182 02/04/2014   GLUCOSE 100* 02/04/2014   CHOL 196 03/30/2015   TRIG 102 03/30/2015   HDL 44* 03/30/2015   LDLCALC 132* 03/30/2015   ALT 8 03/30/2015   AST 14 03/30/2015   NA 144 02/04/2014   K 3.6* 02/04/2014   CL 112 02/04/2014   CREATININE 1.32* 02/04/2014   BUN 14 02/04/2014   CO2 21 02/04/2014   TSH 2.566 01/29/2014   INR 0.98 01/29/2014    BNP (last 3 results) No results for input(s): BNP in the last 8760 hours.  ProBNP (last 3 results) No results for input(s): PROBNP in the last 8760 hours.   Other Studies Reviewed Today:   Assessment/Plan: 1. Dizziness - sounds like she had vertigo - has had recent URI.   2. HTN - says she has better BP control at home. Would continue to monitor. Need to verify her cuff - she does use a wrist cuff. For now, no change in her medicines.   3. HLD - on statin  4. PVD - needs her recall visit with Dr. Allyson Sabal for next month.  Current medicines are reviewed with the patient today.  The patient does not have concerns regarding medicines other than what has been noted above.  The following changes have been made:  See above.  Labs/ tests ordered today include:    Orders Placed This Encounter  Procedures  . Basic metabolic panel  . CBC  . Hepatic function panel  . Lipid panel  . TSH  . EKG 12-Lead     Disposition:   FU with Dr. Allyson Sabal (has recall for April)  Patient is agreeable to this plan and will call if any problems develop in the interim.   Signed: Rosalio Macadamia, RN, ANP-C 03/18/2016 2:55 PM  [facility] [address] [place], [place]   [address] Phone: [phone] Fax: [phone]

## 2016-03-18 NOTE — Patient Instructions (Addendum)
We will be checking the following labs today - BMET, CBC, HPF, Lipids and TSH   Medication Instructions:    Continue with your current medicines.   Ok to use Antivert (meclizine) if needed for dizziness    Testing/Procedures To Be Arranged:  N/A  Follow-Up:   See Dr. Allyson Sabal in April (recall appointment)    Other Special Instructions:   Will get copy of the EKG from the Urgent Care    If you need a refill on your cardiac medications before your next appointment, please call your pharmacy.   Call the [facility] office at [phone] if you have any questions, problems or concerns.

## 2016-03-22 ENCOUNTER — Other Ambulatory Visit: Payer: Self-pay | Admitting: Cardiovascular Disease

## 2016-03-23 NOTE — Telephone Encounter (Signed)
Rx(s) sent to pharmacy electronically.  

## 2016-04-13 ENCOUNTER — Ambulatory Visit: Payer: BLUE CROSS/BLUE SHIELD | Admitting: Cardiovascular Disease

## 2016-05-17 ENCOUNTER — Other Ambulatory Visit: Payer: Self-pay | Admitting: Cardiovascular Disease

## 2016-05-18 ENCOUNTER — Encounter: Payer: Self-pay | Admitting: Cardiovascular Disease

## 2016-05-18 ENCOUNTER — Ambulatory Visit (INDEPENDENT_AMBULATORY_CARE_PROVIDER_SITE_OTHER): Payer: BLUE CROSS/BLUE SHIELD | Admitting: Cardiovascular Disease

## 2016-05-18 VITALS — BP 162/76 | HR 55 | Ht 71.0 in | Wt 173.2 lb

## 2016-05-18 DIAGNOSIS — E785 Hyperlipidemia, unspecified: Secondary | ICD-10-CM

## 2016-05-18 DIAGNOSIS — I10 Essential (primary) hypertension: Secondary | ICD-10-CM

## 2016-05-18 DIAGNOSIS — I739 Peripheral vascular disease, unspecified: Secondary | ICD-10-CM

## 2016-05-18 NOTE — Assessment & Plan Note (Signed)
Ana Edwards does have a history of peripheral arterial disease.I angiogramed her 02/03/14 and performed diamondback orbital rotational atherectomy, PTA and stenting of high-grade calcified right SFA disease resulting in marked improvement in her symptoms.She does have a flush occluded left SFA but denies claudication.

## 2016-05-18 NOTE — Patient Instructions (Signed)
Medication Instructions:  Your physician recommends that you continue on your current medications as directed. Please refer to the Current Medication list given to you today.   Labwork: NONE  Testing/Procedures: NONE  Follow-Up: Your physician wants you to follow-up in: 12 MONTHS WITH DR BERRY. You will receive a reminder letter in the mail two months in advance. If you don't receive a letter, please call our office to schedule the follow-up appointment.  You have been referred to BP CLINIC WITH PHARMACIST IN 1 MONTH.  Please keep a log of your blood pressures for the next month. Bring these readings and your blood pressure cuff that you use to your appointment in the BP clinic.     If you need a refill on your cardiac medications before your next appointment, please call your pharmacy.

## 2016-05-18 NOTE — Assessment & Plan Note (Signed)
History of hypertension with blood pressure measured at 162/76. Her losartan was discontinued since I last saw her because of dizziness and she remains on amlodipine and metoprolol. I told her to keep a daily blood pressure log for 1 month after which I will have her see Ana Edwards in the office for review and evaluation.

## 2016-05-18 NOTE — Progress Notes (Signed)
05/18/2016 Ana Edwards   11-Aug-1956  [id number]  Primary Physician Billee Cashing, MD Primary Cardiologist: Runell Gess MD Roseanne Reno   HPI:  Ana Edwards is a 60 year old moderately overweight divorced African American female mother of 4, grandmother who was referred by Dr. Santiago Bumpers for peripheral vascular evaluation because of life style limiting claudication. I last saw her in the office 04/17/15. Her risk factors include discontinued tobacco abuse and hypertension. She's not diabetic nor is a family history of heart disease. She has never had a heart attack or stroke patient denies chest pain or shortness of breath. 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 The patient relates Progressive symptoms over the last 6 months. Left is greater than right. She presents today for angiography and potential intervention. The Doppler suggests an occluded left SFA with high-grade right SFA disease. I angiogram to her on 02/03/14 revealing an occluded left SFA at its origin reconstituting in the abductor canal with high-grade calcified right SFA stenosis. I ultimately performed diamondback orbital rotational atherectomy, PTCA and stenting using an IDET V stent resulting in excellent and clinical result. She had 1 vessel runoff below the knee the peroneal artery.Her ABI increased from 0.69 to .76 with resolution of her claudication on the right. She currently denies claudication.  Current Outpatient Prescriptions  Medication Sig Dispense Refill  . amLODipine (NORVASC) 5 MG tablet Take 5 mg by mouth daily.   1  . aspirin EC 81 MG tablet Take 81 mg by mouth every other day.    Marland Kitchen atorvastatin (LIPITOR) 40 MG tablet TAKE ONE TABLET BY MOUTH ONCE DAILY 90 tablet 3  . clopidogrel (PLAVIX) 75 MG tablet TAKE ONE TABLET BY MOUTH ONCE DAILY WITH BREAKFAST 90 tablet 2  . metoprolol (LOPRESSOR) 50 MG tablet  Take 25 mg by mouth 2 (two) times daily.      No current facility-administered medications for this visit.    Allergies  Allergen Reactions  . Tetracyclines & Related Nausea And Vomiting and Other (See Comments)    sweats    Social History   Social History  . Marital Status: Single    Spouse Name: N/A  . Number of Children: N/A  . Years of Education: N/A   Occupational History  . Not on file.   Social History Main Topics  . Smoking status: Current Some Day Smoker -- 0.12 packs/day for 18 years    Types: Cigarettes  . Smokeless tobacco: Never Used     Comment: 1 pack per week  . Alcohol Use: No  . Drug Use: No  . Sexual Activity: Not Currently   Other Topics Concern  . Not on file   Social History Narrative     Review of Systems: General: negative for chills, fever, night sweats or weight changes.  Cardiovascular: negative for chest pain, dyspnea on exertion, edema, orthopnea, palpitations, paroxysmal nocturnal dyspnea or shortness of breath Dermatological: negative for rash Respiratory: negative for cough or wheezing Urologic: negative for hematuria Abdominal: negative for nausea, vomiting, diarrhea, bright red blood per rectum, melena, or hematemesis Neurologic: negative for visual changes, syncope, or dizziness All other systems reviewed and are otherwise negative except as noted above.    Blood pressure 162/76, pulse 55, height  (1.803 m), weight 173 lb 3.2 oz (78.563 kg).  General appearance: alert and no distress Neck: no adenopathy, no carotid bruit,  no JVD, supple, symmetrical, trachea midline and thyroid not enlarged, symmetric, no tenderness/mass/nodules Lungs: clear to auscultation bilaterally Heart: regular rate and rhythm, S1, S2 normal, no murmur, click, rub or gallop Extremities: extremities normal, atraumatic, no cyanosis or edema  EKG not performed today  ASSESSMENT AND PLAN:   Hypertension History of hypertension with blood pressure  measured at 162/76. Her losartan was discontinued since I last saw her because of dizziness and she remains on amlodipine and metoprolol. I told her to keep a daily blood pressure log for 1 month after which I will have her see Belenda Cruise in the office for review and evaluation.  Claudication, lifestyle limiting Mrs. Baldus does have a history of peripheral arterial disease.I angiogramed her 02/03/14 and performed diamondback orbital rotational atherectomy, PTA and stenting of high-grade calcified right SFA disease resulting in marked improvement in her symptoms.She does have a flush occluded left SFA but denies claudication.  Hyperlipidemia History of hyperlipidemia on statin therapy with recent lipid profile performed 03/18/16 revealing total cholesterol 147, LDL 84 and HDL of 41.      Runell Gess MD FACP,FACC,FAHA, [facility] 05/18/2016 12:18 PM

## 2016-05-18 NOTE — Assessment & Plan Note (Signed)
History of hyperlipidemia on statin therapy with recent lipid profile performed 03/18/16 revealing total cholesterol 147, LDL 84 and HDL of 41.

## 2016-05-27 ENCOUNTER — Telehealth: Payer: Self-pay | Admitting: Nurse Practitioner

## 2016-05-27 NOTE — Telephone Encounter (Signed)
Records rec via fax from [facility] placed in chart prep bin.

## 2016-06-16 ENCOUNTER — Ambulatory Visit: Payer: BLUE CROSS/BLUE SHIELD

## 2016-06-22 ENCOUNTER — Ambulatory Visit: Payer: BLUE CROSS/BLUE SHIELD

## 2016-12-15 ENCOUNTER — Other Ambulatory Visit: Payer: Self-pay | Admitting: Cardiovascular Disease

## 2016-12-16 NOTE — Telephone Encounter (Signed)
Rx has been sent to the pharmacy electronically. ° °

## 2017-07-31 ENCOUNTER — Other Ambulatory Visit: Payer: Self-pay | Admitting: Nurse Practitioner

## 2018-01-11 ENCOUNTER — Other Ambulatory Visit: Payer: Self-pay | Admitting: Nurse Practitioner

## 2018-01-16 ENCOUNTER — Other Ambulatory Visit: Payer: Self-pay | Admitting: Nurse Practitioner

## 2018-07-16 ENCOUNTER — Telehealth: Payer: Self-pay | Admitting: Diagnostic Neuroimaging

## 2018-07-16 ENCOUNTER — Encounter: Payer: Self-pay | Admitting: Diagnostic Neuroimaging

## 2018-07-16 ENCOUNTER — Ambulatory Visit: Payer: BLUE CROSS/BLUE SHIELD | Admitting: Diagnostic Neuroimaging

## 2018-07-16 VITALS — BP 174/80 | HR 49 | Ht 71.0 in | Wt 169.4 lb

## 2018-07-16 DIAGNOSIS — G959 Disease of spinal cord, unspecified: Secondary | ICD-10-CM

## 2018-07-16 DIAGNOSIS — M6281 Muscle weakness (generalized): Secondary | ICD-10-CM | POA: Diagnosis not present

## 2018-07-16 NOTE — Progress Notes (Addendum)
[facility]  PATIENT: Ana Edwards DOB: 1956-10-10  REFERRING CLINICIAN: A Kendall HISTORY FROM: patient  REASON FOR VISIT: new consult    HISTORICAL  CHIEF COMPLAINT:  Chief Complaint  Patient presents with  . NP  Dr.  Delilah Shan   LE Weakness.    Has LE weakness/ stiffness ( for about yr).     HISTORY OF PRESENT ILLNESS:   62 year old female here for evaluation of lower extremity stiffness.  Patient reports at least one year of lower extremity stiffness and difficulty walking.  I spoke with patient's daughter via phone who tells me that symptoms have been going on for at least 3 years, progressively worsening.  Patient feels some increased stiffness in her right greater than left leg.  Symptoms are worse when is cold outside.  Symptoms are slightly improved when it is warmer patient has been moving around for a while.  She denies any numbness or tingling.  Patient fell down 3 years ago due to this problem.  She also fell down 2 years ago off of her front steps.    Patient denies any bowel or bladder incontinence.  She denies any problems with her arms or hands.  No slurred speech, vision changes or trouble talking.  Saw orthopedic clinic for evaluation.  X-rays and lab work were tested and apparently unremarkable.   REVIEW OF SYSTEMS: Full 14 system review of systems performed and negative with exception of: Weakness aching muscles.  ALLERGIES: Allergies  Allergen Reactions  . Tetracyclines & Related Nausea And Vomiting and Other (See Comments)    sweats    HOME MEDICATIONS: Outpatient Medications Prior to Visit  Medication Sig Dispense Refill  . amLODipine (NORVASC) 5 MG tablet Take 5 mg by mouth daily.   1  . atorvastatin (LIPITOR) 40 MG tablet TAKE ONE TABLET BY MOUTH ONCE DAILY 30 tablet 1  . clopidogrel (PLAVIX) 75 MG tablet TAKE ONE TABLET BY MOUTH ONCE DAILY WITH BREAKFAST 90 tablet 2  . metoprolol (LOPRESSOR) 50 MG tablet Take 25 mg by mouth  2 (two) times daily.     Marland Kitchen aspirin EC 81 MG tablet Take 81 mg by mouth every other day.     No facility-administered medications prior to visit.     PAST MEDICAL HISTORY: Past Medical History:  Diagnosis Date  . Arthritis    "legs sometimes" (02/03/2014)  . CKD (chronic kidney disease) stage 3, GFR 30-59 ml/min (HCC) 02/04/2014  . Claudication ([place])   . GERD (gastroesophageal reflux disease)   . Hyperlipidemia   . Hypertension   . PAD (peripheral artery disease) ([place])   . Tobacco abuse    patient says she stopped smoking June 2015    PAST SURGICAL HISTORY: Past Surgical History:  Procedure Laterality Date  . LOWER EXTREMITY ANGIOGRAM N/A 02/03/2014   Procedure: LOWER EXTREMITY ANGIOGRAM;  Surgeon: Lorretta Harp, MD;  Location: [facility] CATH LAB;  Service: Cardiovascular;  Laterality: N/A;  . PERIPHERAL ATHRECTOMY Right 02/03/2014   w/stent SFA    FAMILY HISTORY: Family History  Problem Relation Age of Onset  . Heart disease Mother   . Arthritis Father     SOCIAL HISTORY: Social History   Socioeconomic History  . Marital status: Single    Spouse name: Not on file  . Number of children: Not on file  . Years of education: Not on file  . Highest education level: Not on file  Occupational History  . Not on file  Social Needs  .  Financial resource strain: Not on file  . Food insecurity:    Worry: Not on file    Inability: Not on file  . Transportation needs:    Medical: Not on file    Non-medical: Not on file  Tobacco Use  . Smoking status: Current Some Day Smoker    Packs/day: 0.12    Years: 18.00    Pack years: 2.16    Types: Cigarettes  . Smokeless tobacco: Never Used  . Tobacco comment: 1 pack per week  Substance and Sexual Activity  . Alcohol use: No    Comment: quit 5 yrs ago,  2014  . Drug use: Never  . Sexual activity: Not Currently  Lifestyle  . Physical activity:    Days per week: Not on file    Minutes per session: Not on file  . Stress: Not on  file  Relationships  . Social connections:    Talks on phone: Not on file    Gets together: Not on file    Attends religious service: Not on file    Active member of club or organization: Not on file    Attends meetings of clubs or organizations: Not on file    Relationship status: Not on file  . Intimate partner violence:    Fear of current or ex partner: Not on file    Emotionally abused: Not on file    Physically abused: Not on file    Forced sexual activity: Not on file  Other Topics Concern  . Not on file  Social History Narrative   Lives wih son,  Works at [employer] ([employer]  AD.  Children 4.  Caffeine, drinks iced tea, and occ soda.     PHYSICAL EXAM  GENERAL EXAM/CONSTITUTIONAL: Vitals:  Vitals:   07/16/18 0846  BP: (!) 174/80  Pulse: (!) 49  Weight: 169 lb 6.4 oz (76.8 kg)  Height: _0  (1.803 m)     Body mass index is 23.63 kg/m. Wt Readings from Last 3 Encounters:  07/16/18 169 lb 6.4 oz (76.8 kg)  05/18/16 173 lb 3.2 oz (78.6 kg)  03/18/16 170 lb 12.8 oz (77.5 kg)     Patient is in no distress; well developed, nourished and groomed; neck is supple  CARDIOVASCULAR:  Examination of carotid arteries is normal; no carotid bruits  Regular rate and rhythm, no murmurs  Examination of peripheral vascular system by observation and palpation is normal  EYES:  Ophthalmoscopic exam of optic discs and posterior segments is normal; no papilledema or hemorrhages  Visual Acuity Screening   Right eye Left eye Both eyes  Without correction: 20/30 20/50   With correction:        MUSCULOSKELETAL:  Gait, strength, tone, movements noted in Neurologic exam below  NEUROLOGIC: MENTAL STATUS:  No flowsheet data found.  awake, alert, oriented to person, place and time  recent and remote memory intact  normal attention and concentration  language fluent, comprehension intact, naming intact  fund of knowledge appropriate  CRANIAL NERVE:   2nd -  no papilledema on fundoscopic exam  2nd, 3rd, 4th, 6th - pupils equal and reactive to light, visual fields full to confrontation, extraocular muscles intact, no nystagmus  5th - facial sensation symmetric  7th - facial strength symmetric  8th - hearing intact  9th - palate elevates symmetrically, uvula midline  11th - shoulder shrug symmetric  12th - tongue protrusion midline  MOTOR:   BUE 5/5, normal tone  BLE --> hip  flexion 3; knee ext/flex (right 3, left 4)  RIGHT >> LEFT LEG INCREASED TONE THROUGHOUT; RIGHT FOOT INVERTED AND PLANTAR FLEXED   SENSORY:   DECR VIBRATION IN RIGHT FOOT (<5 SEC); SLIGHTLY DECR PP IN RIGHT FOOT  NO THORACIC SENSORY LEVEL NOTED   normal and symmetric to light touch, pinprick, temperature, vibration  COORDINATION:   finger-nose-finger, fine finger movements normal  REFLEXES:   deep tendon reflexes --> BUE 2; LEFT KNEE 3+, RIGHT KNEE 2, ANKLES 1; UPGOING TOES WITH PLANTAR STIMULATION (RIGHT >> LEFT)  GAIT/STATION:   SPASTIC GAIT; UNSTEADY     DIAGNOSTIC DATA (LABS, IMAGING, TESTING) - I reviewed patient records, labs, notes, testing and imaging myself where available.  Lab Results  Component Value Date   WBC 6.1 03/18/2016   HGB 11.7 (L) 03/18/2016   HCT 34.4 (L) 03/18/2016   MCV 82.5 03/18/2016   PLT 321 03/18/2016      Component Value Date/Time   NA 140 03/18/2016 1515   K 4.0 03/18/2016 1515   CL 106 03/18/2016 1515   CO2 26 03/18/2016 1515   GLUCOSE 90 03/18/2016 1515   BUN 14 03/18/2016 1515   CREATININE 1.26 (H) 03/18/2016 1515   CALCIUM 9.0 03/18/2016 1515   PROT 6.4 03/18/2016 1515   ALBUMIN 3.8 03/18/2016 1515   AST 12 03/18/2016 1515   ALT 7 03/18/2016 1515   ALKPHOS 70 03/18/2016 1515   BILITOT 0.3 03/18/2016 1515   GFRNONAA 44 (L) 02/04/2014 0550   GFRAA 51 (L) 02/04/2014 0550   Lab Results  Component Value Date   CHOL 147 03/18/2016   HDL 41 (L) 03/18/2016   LDLCALC 84 03/18/2016   TRIG 109  03/18/2016   CHOLHDL 3.6 03/18/2016   No results found for: HGBA1C No results found for: VITAMINB12 Lab Results  Component Value Date   TSH 1.83 03/18/2016    05/02/18 Labs (Requested and reviewed from Dr. Delilah Shan office):  ANA - negative CRP 0.9 ESR 11 TSH 1.91 CCP < 16 RF < 14     ASSESSMENT AND PLAN  62 y.o. year old female here with progressive muscle stiffness, gait difficulty, lower extremely weakness since 2016.  We will proceed with further work-up.   Ddx: CNS autoimmune / inflamm (thoracic spinal cord), CNS vascular (brain, cervical or thoracic), neuromuscular dz (myotonia, myopathy)  1. Myelopathy ([place])   2. 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 MR THORACIC SPINE W WO CONTRAST  . MR LUMBAR SPINE WO CONTRAST   Return in about 3 months (around 10/16/2018).  I reviewed images, labs, notes, records myself. I summarized findings and reviewed with patient, for this high risk condition (suspected spinal cord myelopathy) requiring high complexity decision making.    Penni Bombard, MD 5/62/1308, 6:57 AM Certified in Neurology, Neurophysiology and Neuroimaging  [facility] [address], [place], [place] [address] [phone]

## 2018-07-16 NOTE — Patient Instructions (Signed)
LEG STIFFNESS / WEAKNESS - check MRI thoracic / lumbar spine  HIGH BLOOD PRESSURE  - continue BP meds; follow up with Dr. Algie Coffer

## 2018-07-16 NOTE — Telephone Encounter (Signed)
Dr . Marjory Lies Please call today MRI Lumbar spine wo contrast and MR Thoracic spine w w/o . Telephone - [phone] . Opening's On Truck Tomorrow and Wednesday. [employer] Kept telling me I needed More information about her her arms for scan's to be approved. Thanks Annabelle Harman

## 2018-07-17 ENCOUNTER — Ambulatory Visit (INDEPENDENT_AMBULATORY_CARE_PROVIDER_SITE_OTHER): Payer: BLUE CROSS/BLUE SHIELD

## 2018-07-17 DIAGNOSIS — M6281 Muscle weakness (generalized): Secondary | ICD-10-CM

## 2018-07-17 DIAGNOSIS — G959 Disease of spinal cord, unspecified: Secondary | ICD-10-CM | POA: Diagnosis not present

## 2018-07-17 MED ORDER — GADOPENTETATE DIMEGLUMINE 469.01 MG/ML IV SOLN
16.0000 mL | Freq: Once | INTRAVENOUS | Status: AC | PRN
  Administered 2018-07-17: 16 mL via INTRAVENOUS

## 2018-07-17 NOTE — Telephone Encounter (Signed)
Thanks Dr. Marjory Lies I have talked to Patient and she will be Here today at 12:30 for Her scan's

## 2018-07-17 NOTE — Telephone Encounter (Signed)
I called for approval:   auth number: [id number]   Suanne Marker, MD 07/17/2018, 8:51 AM Certified in Neurology, Neurophysiology and Neuroimaging  [facility] [address] [place], [place] [address] [phone]

## 2018-07-23 ENCOUNTER — Telehealth: Payer: Self-pay | Admitting: Diagnostic Neuroimaging

## 2018-07-23 DIAGNOSIS — R531 Weakness: Secondary | ICD-10-CM

## 2018-07-23 DIAGNOSIS — R269 Unspecified abnormalities of gait and mobility: Secondary | ICD-10-CM

## 2018-07-23 NOTE — Telephone Encounter (Signed)
Spoke with patient and informed her that her MRI results were unremarkable imaging results.  Advised her Dr Marjory Lies may consider MRI brain and cervical spine next.  Advised he will continue with her current plan which may be NCS, MRI of brain and neck. This RN asked how her leg stiffness is at this point. She stated "When I get real cold, that's when I have the leg stiffness. If I'm warm I'm okay." this RN stated will let Dr Marjory Lies know, and if he orders more testing she iwll get a call. She verbalized understanding, appreciation.

## 2018-07-23 NOTE — Telephone Encounter (Signed)
Pt is asking for a call just as soon as the MRI results are available 

## 2018-08-23 NOTE — Telephone Encounter (Signed)
Orders Placed This Encounter  Procedures  . MR BRAIN W WO CONTRAST  . MR CERVICAL SPINE W WO CONTRAST    Suanne Marker, MD 08/23/2018, 5:09 PM Certified in Neurology, Neurophysiology and Neuroimaging  [facility] [address] [place], [place] [address] [phone]

## 2018-08-23 NOTE — Addendum Note (Signed)
Addended by: Joycelyn Schmid R on: 08/23/2018 05:09 PM   Modules accepted: Orders

## 2018-08-27 ENCOUNTER — Telehealth: Payer: Self-pay | Admitting: Diagnostic Neuroimaging

## 2018-08-27 NOTE — Telephone Encounter (Signed)
lvm for pt to call back about scheduling mri  [employer] Auth: [id number] (exp. 08/27/18 to 09/25/18)

## 2018-08-27 NOTE — Telephone Encounter (Signed)
LVM informing patient that Dr Marjory Lies ordered MRI of brain and cervical spine. Advised she call and schedule them. Left office number.

## 2018-09-21 ENCOUNTER — Ambulatory Visit: Payer: BLUE CROSS/BLUE SHIELD | Admitting: Cardiovascular Disease

## 2018-09-21 ENCOUNTER — Encounter: Payer: Self-pay | Admitting: Cardiovascular Disease

## 2018-09-21 VITALS — BP 154/74 | HR 56 | Ht 71.0 in | Wt 170.2 lb

## 2018-09-21 DIAGNOSIS — I10 Essential (primary) hypertension: Secondary | ICD-10-CM | POA: Diagnosis not present

## 2018-09-21 DIAGNOSIS — E7800 Pure hypercholesterolemia, unspecified: Secondary | ICD-10-CM

## 2018-09-21 DIAGNOSIS — I739 Peripheral vascular disease, unspecified: Secondary | ICD-10-CM

## 2018-09-21 NOTE — Patient Instructions (Signed)
Medication Instructions:  Your physician recommends that you continue on your current medications as directed. Please refer to the Current Medication list given to you today.  If you need a refill on your cardiac medications before your next appointment, please call your pharmacy.   Lab work: none If you have labs (blood work) drawn today and your tests are completely normal, you will receive your results only by: . MyChart Message (if you have MyChart) OR . A paper copy in the mail If you have any lab test that is abnormal or we need to change your treatment, we will call you to review the results.  Testing/Procedures: Your physician has requested that you have a lower extremity arterial exercise duplex. During this test, exercise and ultrasound are used to evaluate arterial blood flow in the legs. Allow one hour for this exam. There are no restrictions or special instructions. Your physician has requested that you have an ankle brachial index (ABI). During this test an ultrasound and blood pressure cuff are used to evaluate the arteries that supply the arms and legs with blood. Allow thirty minutes for this exam. There are no restrictions or special instructions.    Follow-Up: At [facility], you and your health needs are our priority.  As part of our continuing mission to provide you with exceptional heart care, we have created designated Provider Care Teams.  These Care Teams include your primary Cardiologist (physician) and Advanced Practice Providers (APPs -  Physician Assistants and Nurse Practitioners) who all work together to provide you with the care you need, when you need it. You will need a follow up appointment in 12 months.  Please call our office 2 months in advance to schedule this appointment.  You may see Jonathan Berry, MD or one of the following Advanced Practice Providers on your designated Care Team:   Luke Kilroy, PA-C Krista Kroeger, PA-C . Callie Goodrich,  PA-C  Any Other Special Instructions Will Be Listed Below (If Applicable).    

## 2018-09-21 NOTE — Assessment & Plan Note (Signed)
History of PAD with angiography performed by myself 02/03/2014 with Dynabac atherectomy, superior stenting of high-grade calcified diffusely diseased proximal and mid right SFA.  She does have left SFA occlusion and bilateral external iliac artery disease with three-vessel runoff.  She is asymptomatic on the left but does complain of "some stiffness on the right side which are different than her preintervention symptoms.  Is been several years since she has had Doppler studies which I will repeat.

## 2018-09-21 NOTE — Assessment & Plan Note (Signed)
History of essential hypertension her blood pressure measured at 182/84 with repeat blood pressure measuring 154/75.  She is on amlodipine and metoprolol.

## 2018-09-21 NOTE — Progress Notes (Signed)
09/21/2018 Ana Edwards   09-27-1956  [id number]  Primary Physician Patient, No Pcp Per Primary Cardiologist: Runell Gess MD Roseanne Reno  HPI:  Ana Edwards is a 62 y.o.  moderately overweight divorced African American female mother of 4, grandmother who was referred by Dr. Santiago Bumpers for peripheral vascular evaluation because of life style limiting claudication. I last saw her in the office 05/18/2016. Her risk factors include discontinued tobacco abuse and hypertension. She's not diabetic nor is a family history of heart disease. She has never had a heart attack or stroke patient denies chest pain or shortness of breath. 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 The patient relates Progressive symptoms over the last 6 months. Left is greater than right. She presents today for angiography and potential intervention. The Doppler suggests an occluded left SFA with high-grade right SFA disease. I angiogram to her on 02/03/14 revealing an occluded left SFA at its origin reconstituting in the abductor canal with high-grade calcified right SFA stenosis. I ultimately performed diamondback orbital rotational atherectomy, PTCA and stenting using an IDET V stent resulting in excellent and clinical result. She had 1 vessel runoff below the knee the peroneal artery.Her ABI increased from 0.69 to .76 with resolution of her claudication on the right. She currently denies claudication. Since I saw her 2 years ago she is remained stable.  She is asymptomatic on her left lower extremity but does complain of some stiffness in her right lower extremity which does not sound like claudication.  She has seen an orthopedic surgeon and a neurologist.  She denies chest pain or shortness of breath.  Current Meds  Medication Sig  . amLODipine (NORVASC) 5 MG tablet Take 5 mg by mouth daily.   Marland Kitchen atorvastatin (LIPITOR) 40  MG tablet TAKE ONE TABLET BY MOUTH ONCE DAILY  . clopidogrel (PLAVIX) 75 MG tablet TAKE ONE TABLET BY MOUTH ONCE DAILY WITH BREAKFAST  . metoprolol (LOPRESSOR) 50 MG tablet Take 25 mg by mouth 2 (two) times daily.      Allergies  Allergen Reactions  . Tetracyclines & Related Nausea And Vomiting and Other (See Comments)    sweats    Social History   Socioeconomic History  . Marital status: Single    Spouse name: Not on file  . Number of children: Not on file  . Years of education: Not on file  . Highest education level: Not on file  Occupational History  . Not on file  Social Needs  . Financial resource strain: Not on file  . Food insecurity:    Worry: Not on file    Inability: Not on file  . Transportation needs:    Medical: Not on file    Non-medical: Not on file  Tobacco Use  . Smoking status: Current Some Day Smoker    Packs/day: 0.12    Years: 18.00    Pack years: 2.16    Types: Cigarettes  . Smokeless tobacco: Never Used  . Tobacco comment: 1 pack per week  Substance and Sexual Activity  . Alcohol use: No    Comment: quit 5 yrs ago,  2014  . Drug use: Never  . Sexual activity: Not Currently  Lifestyle  . Physical activity:    Days per week: Not on file    Minutes per session: Not on file  . Stress: Not  on file  Relationships  . Social connections:    Talks on phone: Not on file    Gets together: Not on file    Attends religious service: Not on file    Active member of club or organization: Not on file    Attends meetings of clubs or organizations: Not on file    Relationship status: Not on file  . Intimate partner violence:    Fear of current or ex partner: Not on file    Emotionally abused: Not on file    Physically abused: Not on file    Forced sexual activity: Not on file  Other Topics Concern  . Not on file  Social History Narrative   Lives wih son,  Works at [employer] ([employer]).  AD.  Children 4.  Caffeine, drinks iced tea, and occ soda.       Review of Systems: General: negative for chills, fever, night sweats or weight changes.  Cardiovascular: negative for chest pain, dyspnea on exertion, edema, orthopnea, palpitations, paroxysmal nocturnal dyspnea or shortness of breath Dermatological: negative for rash Respiratory: negative for cough or wheezing Urologic: negative for hematuria Abdominal: negative for nausea, vomiting, diarrhea, bright red blood per rectum, melena, or hematemesis Neurologic: negative for visual changes, syncope, or dizziness All other systems reviewed and are otherwise negative except as noted above.    Blood pressure (!) 154/75, pulse (!) 56, height 5\' 11"  (1.803 m), weight 170 lb 3.2 oz (77.2 kg).  General appearance: alert and no distress Neck: no adenopathy, no carotid bruit, no JVD, supple, symmetrical, trachea midline and thyroid not enlarged, symmetric, no tenderness/mass/nodules Lungs: clear to auscultation bilaterally Heart: regular rate and rhythm, S1, S2 normal, no murmur, click, rub or gallop Extremities: extremities normal, atraumatic, no cyanosis or edema Pulses: 2+ and symmetric Skin: Skin color, texture, turgor normal. No rashes or lesions Neurologic: Alert and oriented X 3, normal strength and tone. Normal symmetric reflexes. Normal coordination and gait  EKG sinus bradycardia 56 with nonspecific ST and T wave changes.  I personally reviewed this EKG.  ASSESSMENT AND PLAN:   Hypertension History of essential hypertension her blood pressure measured at 182/84 with repeat blood pressure measuring 154/75.  She is on amlodipine and metoprolol.  PAD (peripheral artery disease), 02/03/14 diamondback rotational atherectomy,PTA stent IDEV supera, mid-rt SFA.  Residual tot. L SFA  History of PAD with angiography performed by myself 02/03/2014 with Dynabac atherectomy, superior stenting of high-grade calcified diffusely diseased proximal and mid right SFA.  She does have left SFA occlusion and  bilateral external iliac artery disease with three-vessel runoff.  She is asymptomatic on the left but does complain of "some stiffness on the right side which are different than her preintervention symptoms.  Is been several years since she has had Doppler studies which I will repeat.  Hyperlipidemia History of hyperlipidemia on statin therapy with lipid profile performed 03/18/2016 revealing total cholesterol of 147, LDL of 84 HDL 41.      Runell Gess MD FACP,FACC,FAHA, [facility] 09/21/2018 12:24 PM

## 2018-09-21 NOTE — Assessment & Plan Note (Signed)
History of hyperlipidemia on statin therapy with lipid profile performed 03/18/2016 revealing total cholesterol of 147, LDL of 84 HDL 41.

## 2018-09-25 ENCOUNTER — Other Ambulatory Visit: Payer: Self-pay | Admitting: Cardiovascular Disease

## 2018-09-25 DIAGNOSIS — I739 Peripheral vascular disease, unspecified: Secondary | ICD-10-CM

## 2018-09-28 ENCOUNTER — Encounter (HOSPITAL_COMMUNITY): Payer: BLUE CROSS/BLUE SHIELD

## 2018-09-28 ENCOUNTER — Ambulatory Visit (HOSPITAL_COMMUNITY)
Admission: RE | Admit: 2018-09-28 | Discharge: 2018-09-28 | Disposition: A | Discharge status: Home / Self Care | Payer: BLUE CROSS/BLUE SHIELD | Source: Ambulatory Visit | Attending: Cardiovascular Disease | Admitting: Cardiovascular Disease

## 2018-09-28 DIAGNOSIS — I739 Peripheral vascular disease, unspecified: Secondary | ICD-10-CM | POA: Diagnosis present

## 2018-10-01 ENCOUNTER — Other Ambulatory Visit: Payer: Self-pay | Admitting: *Deleted

## 2018-10-01 DIAGNOSIS — I739 Peripheral vascular disease, unspecified: Secondary | ICD-10-CM

## 2018-10-04 NOTE — Telephone Encounter (Signed)
Patient called in stating she wants her MRI's at GI. I sent the orders to GI I told her if she has not heard in the next 2-3 business days to give them a call at [phone].  Redid the auth:: [id number] (exp. 10/04/18 to 11/02/18)

## 2018-10-17 ENCOUNTER — Ambulatory Visit
Admission: RE | Admit: 2018-10-17 | Discharge: 2018-10-17 | Disposition: A | Discharge status: Home / Self Care | Payer: BLUE CROSS/BLUE SHIELD | Source: Ambulatory Visit | Attending: Diagnostic Neuroimaging | Admitting: Diagnostic Neuroimaging

## 2018-10-17 DIAGNOSIS — R269 Unspecified abnormalities of gait and mobility: Secondary | ICD-10-CM

## 2018-10-17 DIAGNOSIS — R531 Weakness: Secondary | ICD-10-CM

## 2018-10-17 MED ORDER — GADOBENATE DIMEGLUMINE 529 MG/ML IV SOLN
15.0000 mL | Freq: Once | INTRAVENOUS | Status: AC | PRN
  Administered 2018-10-17: 15 mL via INTRAVENOUS

## 2018-10-23 ENCOUNTER — Ambulatory Visit (INDEPENDENT_AMBULATORY_CARE_PROVIDER_SITE_OTHER): Payer: BLUE CROSS/BLUE SHIELD | Admitting: Diagnostic Neuroimaging

## 2018-10-23 ENCOUNTER — Encounter: Payer: Self-pay | Admitting: Diagnostic Neuroimaging

## 2018-10-23 VITALS — BP 176/77 | HR 61 | Ht 71.0 in | Wt 173.4 lb

## 2018-10-23 DIAGNOSIS — R269 Unspecified abnormalities of gait and mobility: Secondary | ICD-10-CM

## 2018-10-23 DIAGNOSIS — M6281 Muscle weakness (generalized): Secondary | ICD-10-CM | POA: Diagnosis not present

## 2018-10-23 DIAGNOSIS — G35 Multiple sclerosis: Secondary | ICD-10-CM | POA: Diagnosis not present

## 2018-10-23 DIAGNOSIS — G959 Disease of spinal cord, unspecified: Secondary | ICD-10-CM

## 2018-10-23 DIAGNOSIS — R531 Weakness: Secondary | ICD-10-CM | POA: Diagnosis not present

## 2018-10-23 HISTORY — DX: Multiple sclerosis: G35

## 2018-10-23 NOTE — Progress Notes (Signed)
[facility]  PATIENT: Ana Edwards DOB: [birth year]  REFERRING CLINICIAN: A Kendall HISTORY FROM: patient  REASON FOR VISIT: follow up   HISTORICAL  CHIEF COMPLAINT:  Chief Complaint  Patient presents with  . Follow-up    Rm 7, alone  . myopathy    stable, no worse.  Using cane.  No falls.     HISTORY OF PRESENT ILLNESS:   UPDATE (10/23/18, VRP): Since last visit, doing about the same. Symptoms are stable. Severity is moderate. No alleviating or aggravating factors. MRI scans reviewed. Right > Left leg stiffness continues. Denies any vision changes, slurred speech, hand problems.  PRIOR HPI (07/16/18): 62 year old female here for evaluation of lower extremity stiffness.  Patient reports at least one year of lower extremity stiffness and difficulty walking.  I spoke with patient's daughter via phone who tells me that symptoms have been going on for at least 3 years, progressively worsening.  Patient feels some increased stiffness in her right greater than left leg.  Symptoms are worse when is cold outside.  Symptoms are slightly improved when it is warmer patient has been moving around for a while.  She denies any numbness or tingling.  Patient fell down 3 years ago due to this problem.  She also fell down 2 years ago off of her front steps.    Patient denies any bowel or bladder incontinence.  She denies any problems with her arms or hands.  No slurred speech, vision changes or trouble talking.  Saw orthopedic clinic for evaluation.  X-rays and lab work were tested and apparently unremarkable.   REVIEW OF SYSTEMS: Full 14 system review of systems performed and negative with exception of: walking diff.  ALLERGIES: Allergies  Allergen Reactions  . Tetracyclines & Related Nausea And Vomiting and Other (See Comments)    sweats    HOME MEDICATIONS: Outpatient Medications Prior to Visit  Medication Sig Dispense Refill  . amLODipine (NORVASC) 5 MG tablet  Take 5 mg by mouth daily.   1  . atorvastatin (LIPITOR) 40 MG tablet TAKE ONE TABLET BY MOUTH ONCE DAILY 30 tablet 1  . clopidogrel (PLAVIX) 75 MG tablet TAKE ONE TABLET BY MOUTH ONCE DAILY WITH BREAKFAST 90 tablet 2  . metoprolol (LOPRESSOR) 50 MG tablet Take 25 mg by mouth 2 (two) times daily.      No facility-administered medications prior to visit.     PAST MEDICAL HISTORY: Past Medical History:  Diagnosis Date  . Arthritis    "legs sometimes" (02/03/2014)  . CKD (chronic kidney disease) stage 3, GFR 30-59 ml/min (HCC) 02/04/2014  . Claudication ([place])   . GERD (gastroesophageal reflux disease)   . Hyperlipidemia   . Hypertension   . PAD (peripheral artery disease) ([place])   . Tobacco abuse    patient says she stopped smoking June 2015    PAST SURGICAL HISTORY: Past Surgical History:  Procedure Laterality Date  . LOWER EXTREMITY ANGIOGRAM N/A 02/03/2014   Procedure: LOWER EXTREMITY ANGIOGRAM;  Surgeon: Lorretta Harp, MD;  Location: [facility] CATH LAB;  Service: Cardiovascular;  Laterality: N/A;  . PERIPHERAL ATHRECTOMY Right 02/03/2014   w/stent SFA    FAMILY HISTORY: Family History  Problem Relation Age of Onset  . Heart disease Mother   . Arthritis Father     SOCIAL HISTORY: Social History   Socioeconomic History  . Marital status: Single    Spouse name: Not on file  . Number of children: Not on file  . Years  of education: Not on file  . Highest education level: Not on file  Occupational History  . Not on file  Social Needs  . Financial resource strain: Not on file  . Food insecurity:    Worry: Not on file    Inability: Not on file  . Transportation needs:    Medical: Not on file    Non-medical: Not on file  Tobacco Use  . Smoking status: Current Some Day Smoker    Packs/day: 0.12    Years: 18.00    Pack years: 2.16    Types: Cigarettes  . Smokeless tobacco: Never Used  . Tobacco comment: 1 pack per week  Substance and Sexual Activity  . Alcohol use: No      Comment: quit 5 yrs ago,  2014  . Drug use: Never  . Sexual activity: Not Currently  Lifestyle  . Physical activity:    Days per week: Not on file    Minutes per session: Not on file  . Stress: Not on file  Relationships  . Social connections:    Talks on phone: Not on file    Gets together: Not on file    Attends religious service: Not on file    Active member of club or organization: Not on file    Attends meetings of clubs or organizations: Not on file    Relationship status: Not on file  . Intimate partner violence:    Fear of current or ex partner: Not on file    Emotionally abused: Not on file    Physically abused: Not on file    Forced sexual activity: Not on file  Other Topics Concern  . Not on file  Social History Narrative   Lives wih son,  Works at [employer]  AD.  Children 4.  Caffeine, drinks iced tea, and occ soda.     PHYSICAL EXAM  GENERAL EXAM/CONSTITUTIONAL: Vitals:  Vitals:   10/23/18 0955  BP: (!) 176/77  Pulse: 61  Weight: 173 lb 6.4 oz (78.7 kg)  Height: '5\' 11"'  (1.803 m)   Body mass index is 24.18 kg/m. Wt Readings from Last 3 Encounters:  10/23/18 173 lb 6.4 oz (78.7 kg)  09/21/18 170 lb 3.2 oz (77.2 kg)  07/16/18 169 lb 6.4 oz (76.8 kg)    Patient is in no distress; well developed, nourished and groomed; neck is supple  CARDIOVASCULAR:  Examination of carotid arteries is normal; no carotid bruits  Regular rate and rhythm, no murmurs  Examination of peripheral vascular system by observation and palpation is normal  EYES:  Ophthalmoscopic exam of optic discs and posterior segments is normal; no papilledema or hemorrhages No exam data present  MUSCULOSKELETAL:  Gait, strength, tone, movements noted in Neurologic exam below  NEUROLOGIC: MENTAL STATUS:  No flowsheet data found.  awake, alert, oriented to person, place and time  recent and remote memory intact  normal attention and concentration  language  fluent, comprehension intact, naming intact  fund of knowledge appropriate  CRANIAL NERVE:   2nd - no papilledema on fundoscopic exam  2nd, 3rd, 4th, 6th - pupils equal and reactive to light, visual fields full to confrontation, extraocular muscles intact, no nystagmus  5th - facial sensation symmetric  7th - facial strength symmetric  8th - hearing intact  9th - palate elevates symmetrically, uvula midline  11th - shoulder shrug symmetric  12th - tongue protrusion midline  MOTOR:   BUE 5/5, normal tone  BLE --> hip  flexion 3; knee ext/flex (right 3, left 4)  RIGHT >> LEFT LEG INCREASED TONE THROUGHOUT   SENSORY:   DECR IN RIGHT FOOT  OTHER WISE normal and symmetric to light touch, temperature, vibration  COORDINATION:   finger-nose-finger, fine finger movements normal  REFLEXES:   deep tendon reflexes --> BUE 2; KNEES 3, ANKLES 1  GAIT/STATION:   SEVERE SPASTIC GAIT; UNSTEADY; USES SINGLE POINT CANE     DIAGNOSTIC DATA (LABS, IMAGING, TESTING) - I reviewed patient records, labs, notes, testing and imaging myself where available.  Lab Results  Component Value Date   WBC 6.1 03/18/2016   HGB 11.7 (L) 03/18/2016   HCT 34.4 (L) 03/18/2016   MCV 82.5 03/18/2016   PLT 321 03/18/2016      Component Value Date/Time   NA 140 03/18/2016 1515   K 4.0 03/18/2016 1515   CL 106 03/18/2016 1515   CO2 26 03/18/2016 1515   GLUCOSE 90 03/18/2016 1515   BUN 14 03/18/2016 1515   CREATININE 1.26 (H) 03/18/2016 1515   CALCIUM 9.0 03/18/2016 1515   PROT 6.4 03/18/2016 1515   ALBUMIN 3.8 03/18/2016 1515   AST 12 03/18/2016 1515   ALT 7 03/18/2016 1515   ALKPHOS 70 03/18/2016 1515   BILITOT 0.3 03/18/2016 1515   GFRNONAA 44 (L) 02/04/2014 0550   GFRAA 51 (L) 02/04/2014 0550   Lab Results  Component Value Date   CHOL 147 03/18/2016   HDL 41 (L) 03/18/2016   LDLCALC 84 03/18/2016   TRIG 109 03/18/2016   CHOLHDL 3.6 03/18/2016   No results found for:  HGBA1C No results found for: VITAMINB12 Lab Results  Component Value Date   TSH 1.83 03/18/2016    05/02/18 Labs (Requested and reviewed from Dr. Delilah Shan office):  ANA - negative CRP 0.9 ESR 11 TSH 1.91 CCP < 16 RF < 14  07/17/18 MRI thoracic spine (with and without) [I reviewed images myself and agree with interpretation. -VRP]  - Mild disc bulging at T2-3, T5-6, T7-8. No spinal stenosis or foraminal narrowing.  - No intrinsic, compressive or abnormal enhancing spinal cord lesions. - Incidental right renal cyst (2.5cm).   07/17/18 MRI lumbar spine (without) [I reviewed images myself and agree with interpretation. -VRP]  - At L4-5: disc bulging and facet hypertrophy and ligamentum flavum hypertrophy with mild spinal stenosis and moderate biforaminal stenosis - At L3-4: disc bulging and facet hypertrophy with mild spinal stenosis and mild biforaminal stenosis - At L2-3: disc bulging and facet hypertrophy with mild biforaminal stenosis  10/18/18 MRI brain (with and without) [I reviewed images myself and agree with interpretation. -VRP]  - Multiple round and ovoid, confluent, periventricular, subcortical, cerebellar T2 hyperintensities.  Some of these are hypointense on T1 views. No abnormal lesions are seen on post contrast views. Considerations include autoimmune, inflammatory, demyelinating, post-infectious, or ischemic etiologies.  - See MRI cervical spine results from same day.  The presence of spinal cord lesions raises possibility of demyelinating disease.  10/18/18 MRI cervical (with and without) [I reviewed images myself and agree with interpretation. -VRP]  - The spinal cord is notable for multiple T2 and STIR hyperintense lesions at C2, C3, C5 and T2.  In combination with MRI brain findings from same day, this raises possibility of chronic demyelinating plaques.  Other autoimmune and inflammatory or parainfectious conditions are also possible. - Mild degenerative changes as noted  above.    ASSESSMENT AND PLAN  62 y.o. year old female here with progressive muscle stiffness,  gait difficulty, lower extremely weakness since 2016.   Ddx: CNS autoimmune / inflamm (brain, cervical)  1. Multiple sclerosis ([place])   2. Myelopathy ([place])   3. Muscle weakness (generalized)   4. Weakness   5. Gait difficulty     PLAN:  MULTIPLE SCLEROSIS (RRMS; brain and spinal cord lesions) - LEG STIFFNESS / WEAKNESS  - will check labs - then consider tecfidera, ocrevus or tysabri - optimize nutrition, exercise, sleep - stop smoking - optimize vitamin D levels - refer to PT  HYPERTENSION (not controlled) - continue BP meds; follow up with PCP  Orders Placed This Encounter  Procedures  . CBC with Differential/Platelet  . Comprehensive metabolic panel  . Pan-ANCA  . Hep B Surface Antibody  . Hep B Surface Antigen  . Hep C Antibody  . Hepatitis B Core AB, Total  . HIV antibody (with reflex)  . RPR  . Stratify JCV Ab (w/ Index) w/ Rflx  . Varicella Zoster Antibody, IgG  . VITAMIN D 25 Hydroxy (Vit-D Deficiency, Fractures)  . Ambulatory referral to Physical Therapy   Return in about 3 months (around 01/23/2019).  I reviewed images, labs, notes, records myself. I summarized findings and reviewed with patient, for this high risk condition (multiple sclerosis) requiring high complexity decision making.     Penni Bombard, MD 88/08/1693, 50:38 AM Certified in Neurology, Neurophysiology and Neuroimaging  [facility] [address], [place], [place] [address] [phone]

## 2018-10-23 NOTE — Patient Instructions (Signed)
MULTIPLE SCLEROSIS (RRMS; brain and spinal cord lesions) - LEG STIFFNESS / WEAKNESS  - will check labs - then consider tecfidera, ocrevus or tysabri - optimize nutrition, exercise, sleep - stop smoking - optimize vitamin D levels - refer to PT  HYPERTENSION (not controlled) - continue BP meds; follow up with PCP

## 2018-10-25 LAB — CBC WITH DIFFERENTIAL/PLATELET
BASOS: 1 %
Basophils Absolute: 0.1 10*3/uL (ref 0.0–0.2)
EOS (ABSOLUTE): 0.1 10*3/uL (ref 0.0–0.4)
Eos: 2 %
Hematocrit: 39.5 % (ref 34.0–46.6)
Hemoglobin: 13.1 g/dL (ref 11.1–15.9)
IMMATURE GRANULOCYTES: 0 %
Immature Grans (Abs): 0 10*3/uL (ref 0.0–0.1)
Lymphocytes Absolute: 1.9 10*3/uL (ref 0.7–3.1)
Lymphs: 31 %
MCH: 27.7 pg (ref 26.6–33.0)
MCHC: 33.2 g/dL (ref 31.5–35.7)
MCV: 84 fL (ref 79–97)
MONOS ABS: 0.5 10*3/uL (ref 0.1–0.9)
Monocytes: 9 %
NEUTROS ABS: 3.5 10*3/uL (ref 1.4–7.0)
NEUTROS PCT: 57 %
PLATELETS: 238 10*3/uL (ref 150–450)
RBC: 4.73 x10E6/uL (ref 3.77–5.28)
RDW: 13.9 % (ref 12.3–15.4)
WBC: 6.1 10*3/uL (ref 3.4–10.8)

## 2018-10-25 LAB — PAN-ANCA
Atypical pANCA: <1:20 {titer}
C-ANCA: <1:20 {titer}
Myeloperoxidase Ab: <9 U/mL (ref 0.0–9.0)
P-ANCA: <1:20 {titer}

## 2018-10-25 LAB — HEPATITIS C ANTIBODY: Hep C Virus Ab: 0.1 s/co ratio (ref 0.0–0.9)

## 2018-10-25 LAB — COMPREHENSIVE METABOLIC PANEL
A/G RATIO: 1.7 (ref 1.2–2.2)
ALK PHOS: 92 IU/L (ref 39–117)
ALT: 10 IU/L (ref 0–32)
AST: 17 IU/L (ref 0–40)
Albumin: 4.3 g/dL (ref 3.6–4.8)
BILIRUBIN TOTAL: 0.4 mg/dL (ref 0.0–1.2)
BUN/Creatinine Ratio: 11 — ABNORMAL LOW (ref 12–28)
BUN: 14 mg/dL (ref 8–27)
CHLORIDE: 107 mmol/L — AB (ref 96–106)
CO2: 23 mmol/L (ref 20–29)
Calcium: 9.6 mg/dL (ref 8.7–10.3)
Creatinine, Ser: 1.26 mg/dL — ABNORMAL HIGH (ref 0.57–1.00)
GFR calc Af Amer: 53 mL/min/{1.73_m2} — ABNORMAL LOW (ref >59)
GFR calc non Af Amer: 46 mL/min/{1.73_m2} — ABNORMAL LOW (ref >59)
Globulin, Total: 2.6 g/dL (ref 1.5–4.5)
Glucose: 108 mg/dL — ABNORMAL HIGH (ref 65–99)
POTASSIUM: 4.6 mmol/L (ref 3.5–5.2)
Sodium: 144 mmol/L (ref 134–144)
Total Protein: 6.9 g/dL (ref 6.0–8.5)

## 2018-10-25 LAB — HEPATITIS B CORE ANTIBODY, TOTAL: Hep B Core Total Ab: NEGATIVE

## 2018-10-25 LAB — HEPATITIS B SURFACE ANTIBODY,QUALITATIVE: Hep B Surface Ab, Qual: NONREACTIVE

## 2018-10-25 LAB — HIV ANTIBODY (ROUTINE TESTING W REFLEX): HIV SCREEN 4TH GENERATION: NONREACTIVE

## 2018-10-25 LAB — RPR: RPR: NONREACTIVE

## 2018-10-25 LAB — VARICELLA ZOSTER ANTIBODY, IGG: Varicella zoster IgG: 1666 index (ref >165)

## 2018-10-25 LAB — HEPATITIS B SURFACE ANTIGEN: Hepatitis B Surface Ag: NEGATIVE

## 2018-10-25 LAB — VITAMIN D 25 HYDROXY (VIT D DEFICIENCY, FRACTURES): Vit D, 25-Hydroxy: 16.1 ng/mL — ABNORMAL LOW (ref 30.0–100.0)

## 2018-10-28 LAB — STRATIFY JCV AB (W/ INDEX) W/ RFLX
Index Value: 1.28 — ABNORMAL HIGH
Stratify JCV (TM) Ab w/Reflex Inhibition: POSITIVE — AB

## 2018-10-31 ENCOUNTER — Telehealth: Payer: Self-pay | Admitting: *Deleted

## 2018-10-31 NOTE — Telephone Encounter (Signed)
Go ahead with ocrevus infusion. -VRP

## 2018-10-31 NOTE — Telephone Encounter (Signed)
LMVM for pt that Dr. Marjory Lies reviewed labs and ok to go ahead with ocrevus infusion.  She is to call back for questions.  Enrollment form given to tina in intrafusion.

## 2018-11-12 NOTE — Progress Notes (Signed)
JCV POSITIVE 1.28 10/31/2018. sy

## 2018-11-29 ENCOUNTER — Ambulatory Visit: Payer: BLUE CROSS/BLUE SHIELD | Admitting: Physical Therapy

## 2018-12-04 ENCOUNTER — Ambulatory Visit: Payer: BLUE CROSS/BLUE SHIELD | Admitting: Rehabilitation

## 2018-12-05 ENCOUNTER — Other Ambulatory Visit: Payer: Self-pay

## 2018-12-05 ENCOUNTER — Ambulatory Visit: Payer: BLUE CROSS/BLUE SHIELD | Attending: Diagnostic Neuroimaging | Admitting: Physical Therapy

## 2018-12-05 ENCOUNTER — Encounter: Payer: Self-pay | Admitting: Physical Therapy

## 2018-12-05 DIAGNOSIS — M6281 Muscle weakness (generalized): Secondary | ICD-10-CM | POA: Diagnosis present

## 2018-12-05 DIAGNOSIS — R2689 Other abnormalities of gait and mobility: Secondary | ICD-10-CM

## 2018-12-05 DIAGNOSIS — R293 Abnormal posture: Secondary | ICD-10-CM

## 2018-12-05 NOTE — Therapy (Signed)
[facility] [address] [place], [place], [address] Phone: [phone]   Fax:  [phone]  Physical Therapy Evaluation  Patient Details  Name: Ana Edwards MRN: [id number] Date of Birth: [birth year] Referring Provider (PT): Ana Edwards   Encounter Date: 12/05/2018  PT End of Session - 12/05/18 1633    Visit Number  1    Number of Visits  17    Date for PT Re-Evaluation  01/07/19    Authorization Type  BCBS    PT Start Time  1400    PT Stop Time  1445    PT Time Calculation (min)  45 min    Equipment Utilized During Treatment  Gait belt    Activity Tolerance  Patient tolerated treatment well;Patient limited by fatigue    Behavior During Therapy  [facility] for tasks assessed/performed       Past Medical History:  Diagnosis Date  . Arthritis    "legs sometimes" (02/03/2014)  . CKD (chronic kidney disease) stage 3, GFR 30-59 ml/min (HCC) 02/04/2014  . Claudication (HCC)   . GERD (gastroesophageal reflux disease)   . Hyperlipidemia   . Hypertension   . Multiple sclerosis (HCC) 10/23/2018   recently dx by dr Ana Edwards   . Neuromuscular disorder (HCC)   . PAD (peripheral artery disease) (HCC)   . Tobacco abuse    patient says she stopped smoking June 2015    Past Surgical History:  Procedure Laterality Date  . LOWER EXTREMITY ANGIOGRAM N/A 02/03/2014   Procedure: LOWER EXTREMITY ANGIOGRAM;  Surgeon: Ana Gess, MD;  Location: [facility] CATH LAB;  Service: Cardiovascular;  Laterality: N/A;  . PERIPHERAL ATHRECTOMY Right 02/03/2014   w/stent SFA    There were no vitals filed for this visit.   Subjective Assessment - 12/05/18 1511    Subjective  Pt stated that she began having difficulty walking about a year ago, seems to come and go.  Reported 2-3 falls over the past year, most recent 1 month ago- no injuries; She stated that she just go the dx of MS from Dr Ana Edwards in [place]- he saw her on the 5th. Her main  complaints is stiffness that makes walking very difficulty after any prolonged sitting and with first getting up in the morning; she has a cane but left it in the car today; she stated that when she go up from waiting room that she was unsteady and very stiff ; movement typically makes her feel better. 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  Still drives and lives with her son in a single level home with 2 steps to enter; she can bath and shower as needed but is very careful; said her UE strength is good and she can pull herself up out of a tub.  She had seen cardio and ortho before neuro referral.  She has 3 daughter who live out of town and one son, who she lives with ; stated her life is very stress free.      Limitations  Sitting;Walking;Standing    How long can you sit comfortably?  15 min    How long can you stand comfortably?  15 min    How long can you walk comfortably?  5 min    Patient Stated Goals  walk better and not move around so slow     Currently in Pain?  No/denies  [facility] PT Assessment - 12/05/18 0001      Assessment   Medical Diagnosis  multiple scleroris    Referring Provider (PT)  Ana Edwards    Onset Date/Surgical Date  10/23/18      Precautions   Precautions  Fall      Balance Screen   Has the patient fallen in the past 6 months  Yes    How many times?  2    Has the patient had a decrease in activity level because of a fear of falling?   Yes    Is the patient reluctant to leave their home because of a fear of falling?   No      Home Environment   Living Environment  Private residence    Living Arrangements  Children    Available Help at Discharge  Family    Type of Home  House    Home Access  Stairs to enter    Entrance Stairs-Number of Steps  2    Entrance Stairs-Rails  Right;Left    Home Layout  One level      Prior Function   Level of Independence  Independent    Vocation  Part time employment     Vocation Requirements  walk long distance , stand , sit prolonged time      Cognition   Overall Cognitive Status  Within Functional Limits for tasks assessed      Sensation   Light Touch  Appears Intact      Coordination   Gross Motor Movements are Fluid and Coordinated  No      Posture/Postural Control   Posture/Postural Control  Postural limitations    Postural Limitations  Anterior pelvic tilt;Weight shift right      ROM / Strength   AROM / PROM / Strength  Strength;AROM      AROM   AROM Assessment Site  Hip    Right/Left Hip  Right;Left    Right Hip Extension  -5    Right Hip ABduction  10    Left Hip Extension  -5    Left Hip ABduction  10      Strength   Strength Assessment Site  Hip    Right/Left Hip  Right;Left    Right Hip Extension  2/5    Right Hip ABduction  2/5    Left Hip Extension  2/5    Left Hip ABduction  2/5      Flexibility   Soft Tissue Assessment /Muscle Length  yes      Bed Mobility   Bed Mobility  Supine to Sit    Supine to Sit  Independent      Transfers   Transfers  Sit to Stand    Sit to Stand  6: Modified independent (Device/Increase time)      Ambulation/Gait   Ambulation/Gait  Yes    Ambulation/Gait Assistance  6: Modified independent (Device/Increase time)    Ambulation Distance (Feet)  115 Feet    Assistive device  None    Gait Pattern  Decreased step length - left;Decreased stride length;Decreased dorsiflexion - left;Left foot flat;Shuffle;Trendelenburg    Ambulation Surface  Level;Indoor    Gait velocity  1.4 ft/sec      Balance   Balance Assessed  Yes      Standardized Balance Assessment   Standardized Balance Assessment  Timed Up and Go Test;Berg Balance Test      Berg Balance Test   Sit to Stand  Able  to stand  independently using hands    Standing Unsupported  Able to stand safely 2 minutes    Sitting with Back Unsupported but Feet Supported on Floor or Stool  Able to sit safely and securely 2 minutes    Stand to  Sit  Controls descent by using hands    Transfers  Able to transfer safely, definite need of hands    Standing Unsupported with Eyes Closed  Able to stand 10 seconds with supervision    Standing Ubsupported with Feet Together  Able to place feet together independently and stand for 1 minute with supervision    From Standing, Reach Forward with Outstretched Arm  Can reach forward >5 cm safely (2")    From Standing Position, Pick up Object from Floor  Unable to pick up shoe, but reaches 2-5 cm (1-2") from shoe and balances independently    From Standing Position, Turn to Look Behind Over each Shoulder  Looks behind one side only/other side shows less weight shift    Turn 360 Degrees  Able to turn 360 degrees safely one side only in 4 seconds or less    Standing Unsupported, Alternately Place Feet on Step/Stool  Able to complete 4 steps without aid or supervision    Standing Unsupported, One Foot in Front  Able to take small step independently and hold 30 seconds    Standing on One Leg  Tries to lift leg/unable to hold 3 seconds but remains standing independently    Total Score  38      Timed Up and Go Test   Normal TUG (seconds)  15                Objective measurements completed on examination: See above findings.              PT Education - 12/05/18 1630    Education Details  began education about MS , started by explaining the proximal weakness and how if affects her gait pattern. explained how her hip abd and extensor weakness and very tight hip flexors are resulting in her current gait pattern.; recommended she try lying prone- which she says she never does - for a passive hip flexor stretch    Person(s) Educated  Patient    Methods  Explanation;Demonstration    Comprehension  Returned demonstration       PT Short Term Goals - 12/05/18 1634      PT SHORT TERM GOAL #1   Title  Pt to be independent with HEP; focus on hip abductor and extensor strength, core control  and hip flexor flexibilty     Baseline  significant proximal LE weakness and limited hip flexor flexibility     Time  4    Period  Weeks    Status  New      PT SHORT TERM GOAL #2   Title  Pt able to sit > 15 min w/o significant tightness and have good quality initial stance/stable    Baseline  sitting causes significant stiffness making initial stance unsteady    Time  4    Status  New        PT Long Term Goals - 12/05/18 1637      PT LONG TERM GOAL #1   Title  Pt will demonstrate a normalized gait pattern with proper foot clearance and symmetrical strides using LRAD at speed of 3 ft/sec and able to maintain for 15 minutes w/o excessive fatigue so she can  tolerate the demands of her job.     Baseline  asymmetric gait pattern; poor foot clearance; trendelneberg pattern due to proximal LE weakness    Time  8    Period  Weeks    Status  New      PT LONG TERM GOAL #2   Title  Pt will be able to stand with symmetric weight distribution for 1 hour to tolerated the demands of her job    Baseline  limited to< 15 min of standing due to fatigue    Time  8    Period  Weeks      PT LONG TERM GOAL #3   Title  Pt will demonstrate low fall risk , evident by BERG > 53 and TUG < 10 seconds    Baseline  berg 38/56  tub 15 seconds     Time  8    Period  Weeks    Status  New             Plan - 12/05/18 1518    Clinical Impression Statement  Pt  Multiple Sclerosis is newly dx, but likely has been present for some time as she reports a slow decline of function and strength.  Her presentation of musculoskeletal limitations include her proximal hip muscle strength and control and hip flexor flexibilty limitation; initially improving her hip abd, and extensor strength and getting illipsoas flexibility should improve her function ; With new Dx we will be doing MS education through the session, began this today.  She is highly motivated and strong in UE and distal LE ; expect to see her balance  scores and gait quality improve significantly;     History and Personal Factors relevant to plan of care:  new dx of MS    Clinical Presentation  Evolving    Clinical Presentation due to:  MS; proximal LE weakness; gait abnormality     Clinical Decision Making  Moderate    Rehab Potential  Good    Clinical Impairments Affecting Rehab Potential  LE weakness, gait abnormality; high fall risk     PT Frequency  2x / week    PT Duration  8 weeks    PT Treatment/Interventions  Gait training;Stair training;Functional mobility training;Therapeutic activities;Therapeutic exercise;Neuromuscular re-education;Patient/family education;Manual techniques;Dry needling    PT Next Visit Plan  Progess hip flexor flexibilty ex and manual tx; contract relax agonist stretching; set up HEP for hip abductor and hip extensors    PT Home Exercise Plan  had her begin by just getting prone several times a day as able     Consulted and Agree with Plan of Care  Patient       Patient will benefit from skilled therapeutic intervention in order to improve the following deficits and impairments:  Postural dysfunction, Impaired tone, Decreased mobility, Decreased coordination, Abnormal gait, Decreased endurance, Decreased activity tolerance, Decreased range of motion, Decreased strength, Difficulty walking, Decreased balance  Visit Diagnosis: Other abnormalities of gait and mobility - Plan: PT plan of care cert/re-cert  Muscle weakness (generalized) - Plan: PT plan of care cert/re-cert  Abnormal posture - Plan: PT plan of care cert/re-cert     Problem List Patient Active Problem List   Diagnosis Date Noted  . Hyperlipidemia 03/10/2015  . PAD (peripheral artery disease), 02/03/14 diamondback rotational atherectomy,PTA stent IDEV supera, mid-rt SFA.  Residual tot. L SFA  02/04/2014  . CKD (chronic kidney disease) stage 3, GFR 30-59 ml/min (HCC) 02/04/2014  . Anemia 02/04/2014  .  Claudication, lifestyle limiting  01/24/2014  . Chest pain at rest 01/09/2012    Class: Acute  . Hypertension 01/09/2012    Class: Chronic    Vashti Hey D PT DPT 12/05/2018, 4:49 PM  [facility] [address] [place], [place], [address] Phone: [phone]   Fax:  [phone]  Name: Ana Edwards MRN: [id number] Date of Birth: 1956-10-19

## 2018-12-18 ENCOUNTER — Ambulatory Visit: Payer: BLUE CROSS/BLUE SHIELD

## 2018-12-18 DIAGNOSIS — R293 Abnormal posture: Secondary | ICD-10-CM

## 2018-12-18 DIAGNOSIS — R2689 Other abnormalities of gait and mobility: Secondary | ICD-10-CM

## 2018-12-18 DIAGNOSIS — M6281 Muscle weakness (generalized): Secondary | ICD-10-CM

## 2018-12-18 NOTE — Patient Instructions (Signed)
Access Code: [email or web address]  URL: [email or web address]  Date: 12/18/2018  Prepared by: Jonathon Jordanhassity Felts   Exercises  Seated Long Arc Quad - 10 reps - 2 sets - 1x daily - 7x weekly  Seated March - 10 reps - 2 sets - 1x daily - 7x weekly  Seated Hip Abduction with Resistance - 10 reps - 2 sets - 1x daily - 7x weekly  Seated Hamstring Stretch - 10 reps - 2 sets - 1x daily - 7x weekly  Seated Hip Adductor Stretch - 10 reps - 2 sets - 1x daily - 7x weekly

## 2018-12-18 NOTE — Therapy (Signed)
[facility] [address] [place], [place], [address] Phone: [phone]   Fax:  [phone]  Physical Therapy Treatment  Patient Details  Name: Ana Edwards MRN: [id number] Date of Birth: 06-Jan-1956 Referring Provider (PT): Penumalli   Encounter Date: 12/18/2018  PT End of Session - 12/18/18 7564    Visit Number  2    Number of Visits  17    Date for PT Re-Evaluation  01/07/19    Authorization Type  BCBS    PT Start Time  0930    PT Stop Time  1014    PT Time Calculation (min)  44 min    Equipment Utilized During Treatment  Gait belt    Activity Tolerance  Patient tolerated treatment well;Patient limited by fatigue    Behavior During Therapy  [facility] for tasks assessed/performed       Past Medical History:  Diagnosis Date  . Arthritis    "legs sometimes" (02/03/2014)  . CKD (chronic kidney disease) stage 3, GFR 30-59 ml/min (HCC) 02/04/2014  . Claudication (HCC)   . GERD (gastroesophageal reflux disease)   . Hyperlipidemia   . Hypertension   . Multiple sclerosis (HCC) 10/23/2018   recently dx by dr Marjory Lies   . Neuromuscular disorder (HCC)   . PAD (peripheral artery disease) (HCC)   . Tobacco abuse    patient says she stopped smoking June 2015    Past Surgical History:  Procedure Laterality Date  . LOWER EXTREMITY ANGIOGRAM N/A 02/03/2014   Procedure: LOWER EXTREMITY ANGIOGRAM;  Surgeon: Runell Gess, MD;  Location: [facility] CATH LAB;  Service: Cardiovascular;  Laterality: N/A;  . PERIPHERAL ATHRECTOMY Right 02/03/2014   w/stent SFA    There were no vitals filed for this visit.  Subjective Assessment - 12/18/18 0935    Subjective  No falls to report, states that she is tight in the mornings but has been stretching with relief. Pt states that she uses the cane in the home sometimes, otherwise she uses the wall or furniture to brace herself.     Limitations  Sitting;Walking;Standing    How long can you sit  comfortably?  15 min    How long can you stand comfortably?  15 min    How long can you walk comfortably?  5 min    Patient Stated Goals  walk better and not move around so slow     Currently in Pain?  No/denies         [facility] Adult PT Treatment/Exercise - 12/18/18 0958      Ambulation/Gait   Ambulation/Gait  Yes    Ambulation/Gait Assistance  5: Supervision    Ambulation/Gait Assistance Details  Pt required VC's for posture, pacing and proper use of AD.     Ambulation Distance (Feet)  115 Feet    Assistive device  Straight cane    Gait Pattern  Decreased step length - left;Decreased stride length;Decreased dorsiflexion - left;Left foot flat;Shuffle;Trendelenburg    Ambulation Surface  Level;Indoor      Exercises   Exercises  Knee/Hip      Knee/Hip Exercises: Stretches   Active Hamstring Stretch  Both;10 seconds;Other (comment)   10reps   Other Knee/Hip Stretches  seated hip adductor stretch, 10reps, 10sec hold      Knee/Hip Exercises: Aerobic   Nustep  L2, 10 mins for warmup      Knee/Hip Exercises: Seated   Long Arc Quad  AROM;Strengthening;Both;1 set;10 reps    [employer]  AROM;Strengthening;Both;1 set;10 reps    Abduction/Adduction   AROM;Strengthening;Both;1 set;10 reps             PT Education - 12/18/18 1018    Education Details  Initiated seated HEP for strengthening and stretching.     Person(s) Educated  Patient    Methods  Explanation;Demonstration;Tactile cues;Verbal cues;Handout    Comprehension  Verbalized understanding;Returned demonstration;Verbal cues required;Tactile cues required;Need further instruction       PT Short Term Goals - 12/05/18 1634      PT SHORT TERM GOAL #1   Title  Pt to be independent with HEP; focus on hip abductor and extensor strength, core control and hip flexor flexibilty     Baseline  significant proximal LE weakness and limited hip flexor flexibility     Time  4    Period  Weeks    Status  New      PT SHORT TERM GOAL  #2   Title  Pt able to sit > 15 min w/o significant tightness and have good quality initial stance/stable    Baseline  sitting causes significant stiffness making initial stance unsteady    Time  4    Status  New        PT Long Term Goals - 12/05/18 1637      PT LONG TERM GOAL #1   Title  Pt will demonstrate a normalized gait pattern with proper foot clearance and symmetrical strides using LRAD at speed of 3 ft/sec and able to maintain for 15 minutes w/o excessive fatigue so she can tolerate the demands of her job.     Baseline  asymmetric gait pattern; poor foot clearance; trendelneberg pattern due to proximal LE weakness    Time  8    Period  Weeks    Status  New      PT LONG TERM GOAL #2   Title  Pt will be able to stand with symmetric weight distribution for 1 hour to tolerated the demands of her job    Baseline  limited to< 15 min of standing due to fatigue    Time  8    Period  Weeks      PT LONG TERM GOAL #3   Title  Pt will demonstrate low fall risk , evident by BERG > 53 and TUG < 10 seconds    Baseline  berg 38/56  tub 15 seconds     Time  8    Period  Weeks    Status  New       Plan - 12/18/18 1244    Clinical Impression Statement  Todays skilled session focused on gait training with SPC with supervision for safety, min/mod instability during ambulation with no LOB noted, Nustep for warm up to increase ROM/tissue extensiblity and therapeutic exercise to initiate HEP. Pt should benefit from PT sessions to progress towards goals.     Rehab Potential  Good    Clinical Impairments Affecting Rehab Potential  LE weakness, gait abnormality; high fall risk     PT Frequency  2x / week    PT Duration  8 weeks    PT Treatment/Interventions  Gait training;Stair training;Functional mobility training;Therapeutic activities;Therapeutic exercise;Neuromuscular re-education;Patient/family education;Manual techniques;Dry needling    PT Next Visit Plan  Progess hip flexor flexibilty ex  and manual tx; contract relax agonist stretching; set up HEP for hip abductor and hip extensors, gait training, high level balance.     PT Home Exercise Plan  [email or web address]  Consulted and Agree with Plan of Care  Patient       Patient will benefit from skilled therapeutic intervention in order to improve the following deficits and impairments:  Postural dysfunction, Impaired tone, Decreased mobility, Decreased coordination, Abnormal gait, Decreased endurance, Decreased activity tolerance, Decreased range of motion, Decreased strength, Difficulty walking, Decreased balance  Visit Diagnosis: Other abnormalities of gait and mobility  Muscle weakness (generalized)  Abnormal posture     Problem List Patient Active Problem List   Diagnosis Date Noted  . Hyperlipidemia 03/10/2015  . PAD (peripheral artery disease), 02/03/14 diamondback rotational atherectomy,PTA stent IDEV supera, mid-rt SFA.  Residual tot. L SFA  02/04/2014  . CKD (chronic kidney disease) stage 3, GFR 30-59 ml/min (HCC) 02/04/2014  . Anemia 02/04/2014  . Claudication, lifestyle limiting 01/24/2014  . Chest pain at rest 01/09/2012    Class: Acute  . Hypertension 01/09/2012    Class: Chronic   Chassity Felts, PTA  Chassity A Felts 12/18/2018, 12:48 PM  [facility] [address] [place], [place], [address] Phone: [phone]   Fax:  [phone]  Name: ANNALIZA WILLIG MRN: [id number] Date of Birth: 09-30-1956

## 2018-12-21 ENCOUNTER — Ambulatory Visit: Payer: BLUE CROSS/BLUE SHIELD

## 2018-12-25 ENCOUNTER — Ambulatory Visit: Payer: BLUE CROSS/BLUE SHIELD

## 2018-12-26 ENCOUNTER — Ambulatory Visit: Payer: BLUE CROSS/BLUE SHIELD | Admitting: Physical Therapy

## 2018-12-28 ENCOUNTER — Ambulatory Visit: Payer: BLUE CROSS/BLUE SHIELD | Admitting: Physical Therapy

## 2018-12-31 NOTE — Telephone Encounter (Signed)
Received PA approval for Ocrevus at [facility] 11-12-18 recertification date is 11-12-19 PA # [id number]. This approved for 1,800 units.  Per insurance place of pts Buy and bill is available for Ocrevus.  PA approval given to tina in intrafusion.

## 2019-01-01 ENCOUNTER — Ambulatory Visit: Payer: BLUE CROSS/BLUE SHIELD

## 2019-01-02 ENCOUNTER — Ambulatory Visit: Payer: BLUE CROSS/BLUE SHIELD | Admitting: Physical Therapy

## 2019-01-04 ENCOUNTER — Ambulatory Visit: Payer: BLUE CROSS/BLUE SHIELD

## 2019-01-08 ENCOUNTER — Ambulatory Visit: Payer: BLUE CROSS/BLUE SHIELD

## 2019-01-09 ENCOUNTER — Ambulatory Visit: Payer: BLUE CROSS/BLUE SHIELD | Attending: Diagnostic Neuroimaging | Admitting: Physical Therapy

## 2019-01-11 ENCOUNTER — Ambulatory Visit: Payer: BLUE CROSS/BLUE SHIELD

## 2019-01-15 ENCOUNTER — Ambulatory Visit: Payer: BLUE CROSS/BLUE SHIELD

## 2019-01-16 ENCOUNTER — Ambulatory Visit: Payer: BLUE CROSS/BLUE SHIELD | Admitting: Physical Therapy

## 2019-01-18 ENCOUNTER — Ambulatory Visit: Payer: BLUE CROSS/BLUE SHIELD

## 2019-01-22 ENCOUNTER — Ambulatory Visit: Payer: BLUE CROSS/BLUE SHIELD | Admitting: Physical Therapy

## 2019-01-23 ENCOUNTER — Ambulatory Visit: Payer: BLUE CROSS/BLUE SHIELD | Attending: Diagnostic Neuroimaging | Admitting: Physical Therapy

## 2019-01-25 ENCOUNTER — Ambulatory Visit: Payer: BLUE CROSS/BLUE SHIELD | Admitting: Physical Therapy

## 2019-01-29 ENCOUNTER — Ambulatory Visit: Payer: BLUE CROSS/BLUE SHIELD | Admitting: Physical Therapy

## 2019-01-30 ENCOUNTER — Ambulatory Visit: Payer: BLUE CROSS/BLUE SHIELD | Admitting: Physical Therapy

## 2019-02-01 ENCOUNTER — Ambulatory Visit: Payer: BLUE CROSS/BLUE SHIELD | Admitting: Physical Therapy

## 2019-02-05 ENCOUNTER — Ambulatory Visit: Payer: BLUE CROSS/BLUE SHIELD | Admitting: Physical Therapy

## 2019-02-06 ENCOUNTER — Ambulatory Visit: Payer: BLUE CROSS/BLUE SHIELD | Admitting: Physical Therapy

## 2019-02-08 ENCOUNTER — Ambulatory Visit: Payer: BLUE CROSS/BLUE SHIELD | Admitting: Physical Therapy

## 2019-03-05 ENCOUNTER — Encounter: Payer: Self-pay | Admitting: Diagnostic Neuroimaging

## 2019-03-05 ENCOUNTER — Ambulatory Visit (INDEPENDENT_AMBULATORY_CARE_PROVIDER_SITE_OTHER): Payer: BLUE CROSS/BLUE SHIELD | Admitting: Diagnostic Neuroimaging

## 2019-03-05 ENCOUNTER — Other Ambulatory Visit: Payer: Self-pay

## 2019-03-05 VITALS — BP 138/72 | HR 56 | Ht 71.0 in | Wt 173.2 lb

## 2019-03-05 DIAGNOSIS — G35 Multiple sclerosis: Secondary | ICD-10-CM | POA: Diagnosis not present

## 2019-03-05 DIAGNOSIS — R269 Unspecified abnormalities of gait and mobility: Secondary | ICD-10-CM

## 2019-03-05 NOTE — Progress Notes (Signed)
[facility]  PATIENT: Ana Edwards DOB: 08-Sep-1956  REFERRING CLINICIAN: A Kendall HISTORY FROM: patient  REASON FOR VISIT: follow up   HISTORICAL  CHIEF COMPLAINT:  Chief Complaint  Patient presents with  . Multiple Sclerosis    rm 7, "right leg cramps sometimes; I got a call about Ocrevus recently, was told it was ordered"  . Follow-up    3 month    HISTORY OF PRESENT ILLNESS:   UPDATE (03/05/19, VRP): Since last visit, doing about the same. Having some confusion and diff getting started on ocrevus. Symptoms are stable. Severity is mild. No alleviating or aggravating factors.    UPDATE (10/23/18, VRP): Since last visit, doing about the same. Symptoms are stable. Severity is moderate. No alleviating or aggravating factors. MRI scans reviewed. Right > Left leg stiffness continues. Denies any vision changes, slurred speech, hand problems.  PRIOR HPI (07/16/18): 63 year old female here for evaluation of lower extremity stiffness.  Patient reports at least one year of lower extremity stiffness and difficulty walking.  I spoke with patient's daughter via phone who tells me that symptoms have been going on for at least 3 years, progressively worsening.  Patient feels some increased stiffness in her right greater than left leg.  Symptoms are worse when is cold outside.  Symptoms are slightly improved when it is warmer patient has been moving around for a while.  She denies any numbness or tingling.  Patient fell down 3 years ago due to this problem.  She also fell down 2 years ago off of her front steps.    Patient denies any bowel or bladder incontinence.  She denies any problems with her arms or hands.  No slurred speech, vision changes or trouble talking.  Saw orthopedic clinic for evaluation.  X-rays and lab work were tested and apparently unremarkable.   REVIEW OF SYSTEMS: Full 14 system review of systems performed and negative with exception of: MUSCLE CRAMPS  WALKING DIFF.  ALLERGIES: Allergies  Allergen Reactions  . Tetracyclines & Related Nausea And Vomiting and Other (See Comments)    sweats    HOME MEDICATIONS: Outpatient Medications Prior to Visit  Medication Sig Dispense Refill  . amLODipine (NORVASC) 5 MG tablet Take 5 mg by mouth daily.   1  . atorvastatin (LIPITOR) 40 MG tablet TAKE ONE TABLET BY MOUTH ONCE DAILY 30 tablet 1  . clopidogrel (PLAVIX) 75 MG tablet TAKE ONE TABLET BY MOUTH ONCE DAILY WITH BREAKFAST 90 tablet 2  . metoprolol (LOPRESSOR) 50 MG tablet Take 25 mg by mouth 2 (two) times daily.      No facility-administered medications prior to visit.     PAST MEDICAL HISTORY: Past Medical History:  Diagnosis Date  . Arthritis    "legs sometimes" (02/03/2014)  . CKD (chronic kidney disease) stage 3, GFR 30-59 ml/min (HCC) 02/04/2014  . Claudication ([place])   . GERD (gastroesophageal reflux disease)   . Hyperlipidemia   . Hypertension   . Multiple sclerosis ([place]) 10/23/2018   recently dx by dr Leta Baptist   . Neuromuscular disorder ([place])   . PAD (peripheral artery disease) ([place])   . Tobacco abuse    patient says she stopped smoking June 2015    PAST SURGICAL HISTORY: Past Surgical History:  Procedure Laterality Date  . LOWER EXTREMITY ANGIOGRAM N/A 02/03/2014   Procedure: LOWER EXTREMITY ANGIOGRAM;  Surgeon: Lorretta Harp, MD;  Location: [facility] CATH LAB;  Service: Cardiovascular;  Laterality: N/A;  . PERIPHERAL ATHRECTOMY Right 02/03/2014  w/stent SFA    FAMILY HISTORY: Family History  Problem Relation Age of Onset  . Heart disease Mother   . Arthritis Father   . Heart attack Brother     SOCIAL HISTORY: Social History   Socioeconomic History  . Marital status: Single    Spouse name: Not on file  . Number of children: Not on file  . Years of education: Not on file  . Highest education level: Not on file  Occupational History  . Not on file  Social Needs  . Financial resource strain: Not on file  .  Food insecurity:    Worry: Not on file    Inability: Not on file  . Transportation needs:    Medical: Not on file    Non-medical: Not on file  Tobacco Use  . Smoking status: Current Some Day Smoker    Packs/day: 0.12    Years: 18.00    Pack years: 2.16    Types: Cigarettes  . Smokeless tobacco: Never Used  . Tobacco comment: < 1 pack per week  Substance and Sexual Activity  . Alcohol use: No    Comment: quit 5 yrs ago,  2014  . Drug use: Never  . Sexual activity: Not Currently  Lifestyle  . Physical activity:    Days per week: Not on file    Minutes per session: Not on file  . Stress: Not at all  Relationships  . Social connections:    Talks on phone: Not on file    Gets together: Not on file    Attends religious service: Not on file    Active member of club or organization: Not on file    Attends meetings of clubs or organizations: Not on file    Relationship status: Not on file  . Intimate partner violence:    Fear of current or ex partner: Not on file    Emotionally abused: Not on file    Physically abused: Not on file    Forced sexual activity: Not on file  Other Topics Concern  . Not on file  Social History Narrative   Lives wih son,  Works at [employer]  AD.  Children 4.  Caffeine, drinks iced tea, and occ soda.     PHYSICAL EXAM  GENERAL EXAM/CONSTITUTIONAL: Vitals:  Vitals:   03/05/19 0847  BP: 138/72  Pulse: (!) 56  Weight: 173 lb 3.2 oz (78.6 kg)  Height: _0  (1.803 m)   Body mass index is 24.16 kg/m. Wt Readings from Last 3 Encounters:  03/05/19 173 lb 3.2 oz (78.6 kg)  10/23/18 173 lb 6.4 oz (78.7 kg)  09/21/18 170 lb 3.2 oz (77.2 kg)    Patient is in no distress; well developed, nourished and groomed; neck is supple  CARDIOVASCULAR:  Examination of carotid arteries is normal; no carotid bruits  Regular rate and rhythm, no murmurs  Examination of peripheral vascular system by observation and palpation is normal  EYES:   Ophthalmoscopic exam of optic discs and posterior segments is normal; no papilledema or hemorrhages Vision Screening Comments: No changes noticed since last OV  MUSCULOSKELETAL:  Gait, strength, tone, movements noted in Neurologic exam below  NEUROLOGIC: MENTAL STATUS:  No flowsheet data found.  awake, alert, oriented to person, place and time  recent and remote memory intact  normal attention and concentration  language fluent, comprehension intact, naming intact  fund of knowledge appropriate  CRANIAL NERVE:   2nd - no papilledema on fundoscopic  exam  2nd, 3rd, 4th, 6th - pupils equal and reactive to light, visual fields full to confrontation, extraocular muscles intact, no nystagmus  5th - facial sensation symmetric  7th - facial strength symmetric  8th - hearing intact  9th - palate elevates symmetrically, uvula midline  11th - shoulder shrug symmetric  12th - tongue protrusion midline  MOTOR:   BUE 5/5, normal tone  BLE --> hip flexion 3; knee ext/flex (right 3, left 4)  RIGHT >> LEFT LEG INCREASED TONE THROUGHOUT   SENSORY:   DECR IN RIGHT FOOT  OTHER WISE normal and symmetric to light touch, temperature, vibration  COORDINATION:   finger-nose-finger, fine finger movements normal  REFLEXES:   deep tendon reflexes --> BUE 2; KNEES 3, ANKLES 1  GAIT/STATION:   SEVERE SPASTIC GAIT; UNSTEADY; USES SINGLE POINT CANE     DIAGNOSTIC DATA (LABS, IMAGING, TESTING) - I reviewed patient records, labs, notes, testing and imaging myself where available.  Lab Results  Component Value Date   WBC 6.1 10/23/2018   HGB 13.1 10/23/2018   HCT 39.5 10/23/2018   MCV 84 10/23/2018   PLT 238 10/23/2018      Component Value Date/Time   NA 144 10/23/2018 1121   K 4.6 10/23/2018 1121   CL 107 (H) 10/23/2018 1121   CO2 23 10/23/2018 1121   GLUCOSE 108 (H) 10/23/2018 1121   GLUCOSE 90 03/18/2016 1515   BUN 14 10/23/2018 1121   CREATININE 1.26 (H)  10/23/2018 1121   CREATININE 1.26 (H) 03/18/2016 1515   CALCIUM 9.6 10/23/2018 1121   PROT 6.9 10/23/2018 1121   ALBUMIN 4.3 10/23/2018 1121   AST 17 10/23/2018 1121   ALT 10 10/23/2018 1121   ALKPHOS 92 10/23/2018 1121   BILITOT 0.4 10/23/2018 1121   GFRNONAA 46 (L) 10/23/2018 1121   GFRAA 53 (L) 10/23/2018 1121   Lab Results  Component Value Date   CHOL 147 03/18/2016   HDL 41 (L) 03/18/2016   LDLCALC 84 03/18/2016   TRIG 109 03/18/2016   CHOLHDL 3.6 03/18/2016   No results found for: HGBA1C No results found for: VITAMINB12 Lab Results  Component Value Date   TSH 1.83 03/18/2016    05/02/18 Labs (Requested and reviewed from Dr. Delilah Shan office):  ANA - negative CRP 0.9 ESR 11 TSH 1.91 CCP < 16 RF < 14  07/17/18 MRI thoracic spine (with and without) [I reviewed images myself and agree with interpretation. -VRP]  - Mild disc bulging at T2-3, T5-6, T7-8. No spinal stenosis or foraminal narrowing.  - No intrinsic, compressive or abnormal enhancing spinal cord lesions. - Incidental right renal cyst (2.5cm).   07/17/18 MRI lumbar spine (without) [I reviewed images myself and agree with interpretation. -VRP]  - At L4-5: disc bulging and facet hypertrophy and ligamentum flavum hypertrophy with mild spinal stenosis and moderate biforaminal stenosis - At L3-4: disc bulging and facet hypertrophy with mild spinal stenosis and mild biforaminal stenosis - At L2-3: disc bulging and facet hypertrophy with mild biforaminal stenosis  10/18/18 MRI brain (with and without) [I reviewed images myself and agree with interpretation. -VRP]  - Multiple round and ovoid, confluent, periventricular, subcortical, cerebellar T2 hyperintensities.  Some of these are hypointense on T1 views. No abnormal lesions are seen on post contrast views. Considerations include autoimmune, inflammatory, demyelinating, post-infectious, or ischemic etiologies.  - See MRI cervical spine results from same day.  The  presence of spinal cord lesions raises possibility of demyelinating disease.  10/18/18 MRI  cervical (with and without) [I reviewed images myself and agree with interpretation. -VRP]  - The spinal cord is notable for multiple T2 and STIR hyperintense lesions at C2, C3, C5 and T2.  In combination with MRI brain findings from same day, this raises possibility of chronic demyelinating plaques.  Other autoimmune and inflammatory or parainfectious conditions are also possible. - Mild degenerative changes as noted above.    ASSESSMENT AND PLAN  63 y.o. year old female here with progressive muscle stiffness, gait difficulty, lower extremely weakness since 2016; history, exam and MRI consistent with RRMS.    Dx: CNS autoimmune / inflamm (brain, cervical)  1. MS (multiple sclerosis) ([place])      PLAN:  MULTIPLE SCLEROSIS (RRMS; brain and spinal cord lesions) - LEG STIFFNESS / WEAKNESS  - start ocrevus - optimize nutrition, exercise, sleep - stop smoking - optimize vitamin D levels  HYPERTENSION (improved) - continue BP meds; follow up with PCP  Orders Placed This Encounter  Procedures  . Ambulatory referral to Physical Therapy   Return in about 6 months (around 09/05/2019).    Penni Bombard, MD 7/33/1250, 8:71 AM Certified in Neurology, Neurophysiology and Neuroimaging  [facility] [address] [place], [place] [address] [phone]

## 2019-03-14 ENCOUNTER — Telehealth: Payer: Self-pay | Admitting: *Deleted

## 2019-03-14 NOTE — Telephone Encounter (Signed)
[employer] Copaxone (glatiramer Acetate)  start form, supporting documents faxed to [employer].

## 2019-03-14 NOTE — Telephone Encounter (Addendum)
Per intrafusion, the patient's Ocrevus infusions would be very costly if done in this office. Patient's insurance is with BCBS with [facility] health. I called [facility], spoke with [facility] and advised her of situation. She took patient's demographic information and stated she would send note to RN. She asked for all pertinent documents to be sent to fax [phone], and she'll have RN call me back. I thanked her.  Discussed with Dr Marjory Lies who stated due to COVID 19, he will not start patient on Ocrevus but Copaxone or it's generic. Called patient and explained the above. I advised her per Dr Marjory Lies, Copaxone and it's generic are effective and safe, so he would like to start her on Copaxone. I explained how it's administered, advised her typically she would come in to sign a form. However due to covid 24 Dr Marjory Lies doesn't want her to be unnecessarily exposed.   I advised I will read to her the patient authorization portion of form. If she agrees, I will put her on speaker and  myself and Alverda Skeans RN will get her verbal consent to sign the start form. Patient agreed, was put on speaker and I had her state name and DOB. She then gave verbal consent for signature on [employer] Copaxone start form.  She asked questions which were answered to her satisfaction. I advised her she will receive a copy of the consent in the mail. I gave her our office number for any questions, advised she should get a call from [employer] within about a  week. She verbalized understanding, appreciation. Copaxone start form signed by myself and Loney Hering.  Called [facility] infusion and advised  Dr Marjory Lies will not begin Ocrevus on patient. She verbalized understanding, appreciation, stated she would let Misty know.

## 2019-03-27 NOTE — Telephone Encounter (Addendum)
Called [employer] to check status of Copaxone start form which was faxed on 03/14/2019. Spoke with Jacki Cones who stated they have tried to reach patient twice, will try again tomorrow. They need her verbal consent to go forward on Copaxone Rx. I confirmed they have correct numbers and advised they also try her mobile. Advised I will call patient to let her know when she receives a [phone] call to answer it.  Called patient, LVM  and advised her of above conversation.

## 2019-04-15 NOTE — Telephone Encounter (Signed)
Received fax from [facility] re: Glatiramer Acetate Rx- patient requests [facility] to administer Rx. Spoke with Judeth Cornfield, pharmacist and advised her Dr Marjory Lies gives verbal consent to send patient the whisperjet to administer Glatiramer Acetate. Judeth Cornfield stated they will ship medication to patient, verbalized understanding, appreciation.

## 2019-05-30 ENCOUNTER — Telehealth: Payer: Self-pay | Admitting: *Deleted

## 2019-05-30 NOTE — Telephone Encounter (Signed)
Spoke with patient and asked her if she's doing well on Copaxone. She stated she is, everything is going fine. She then asked if she should continue with PT. I advised her to call [facility] and discuss resuming PT with them. I gave her #, and she verbalized understanding, appreciation.

## 2019-09-10 ENCOUNTER — Encounter: Payer: Self-pay | Admitting: Diagnostic Neuroimaging

## 2019-09-10 ENCOUNTER — Other Ambulatory Visit: Payer: Self-pay

## 2019-09-10 ENCOUNTER — Ambulatory Visit (INDEPENDENT_AMBULATORY_CARE_PROVIDER_SITE_OTHER): Payer: BLUE CROSS/BLUE SHIELD | Admitting: Diagnostic Neuroimaging

## 2019-09-10 VITALS — BP 181/81 | HR 57 | Temp 97.1°F | Ht 71.0 in | Wt 173.6 lb

## 2019-09-10 DIAGNOSIS — G35 Multiple sclerosis: Secondary | ICD-10-CM | POA: Diagnosis not present

## 2019-09-10 DIAGNOSIS — R269 Unspecified abnormalities of gait and mobility: Secondary | ICD-10-CM | POA: Diagnosis not present

## 2019-09-10 NOTE — Progress Notes (Signed)
[facility]  PATIENT: Ana Edwards DOB: June 03, 1956  REFERRING CLINICIAN: A Kendall HISTORY FROM: patient  REASON FOR VISIT: follow up   HISTORICAL  CHIEF COMPLAINT:  Chief Complaint  Patient presents with  . Multiple Sclerosis    rm 7, 6 month FU, "on glatiramer injections, doing well, my stiffness is improving"    HISTORY OF PRESENT ILLNESS:   UPDATE (09/10/19, VRP): Since last visit, doing well. Symptoms are stable. Severity is mild. No alleviating or aggravating factors. Tolerating copaxone (generic).    UPDATE (03/05/19, VRP): Since last visit, doing about the same. Having some confusion and diff getting started on ocrevus. Symptoms are stable. Severity is mild. No alleviating or aggravating factors.    UPDATE (10/23/18, VRP): Since last visit, doing about the same. Symptoms are stable. Severity is moderate. No alleviating or aggravating factors. MRI scans reviewed. Right > Left leg stiffness continues. Denies any vision changes, slurred speech, hand problems.  PRIOR HPI (07/16/18): 63 year old female here for evaluation of lower extremity stiffness.  Patient reports at least one year of lower extremity stiffness and difficulty walking.  I spoke with patient's daughter via phone who tells me that symptoms have been going on for at least 3 years, progressively worsening.  Patient feels some increased stiffness in her right greater than left leg.  Symptoms are worse when is cold outside.  Symptoms are slightly improved when it is warmer patient has been moving around for a while.  She denies any numbness or tingling.  Patient fell down 3 years ago due to this problem.  She also fell down 2 years ago off of her front steps.    Patient denies any bowel or bladder incontinence.  She denies any problems with her arms or hands.  No slurred speech, vision changes or trouble talking.  Saw orthopedic clinic for evaluation.  X-rays and lab work were tested and apparently  unremarkable.   REVIEW OF SYSTEMS: Full 14 system review of systems performed and negative with exception of: MUSCLE CRAMPS WALKING DIFF.  ALLERGIES: Allergies  Allergen Reactions  . Tetracyclines & Related Nausea And Vomiting and Other (See Comments)    sweats    HOME MEDICATIONS: Outpatient Medications Prior to Visit  Medication Sig Dispense Refill  . amLODipine (NORVASC) 5 MG tablet Take 5 mg by mouth daily.   1  . atorvastatin (LIPITOR) 40 MG tablet TAKE ONE TABLET BY MOUTH ONCE DAILY 30 tablet 1  . clopidogrel (PLAVIX) 75 MG tablet TAKE ONE TABLET BY MOUTH ONCE DAILY WITH BREAKFAST 90 tablet 2  . Glatiramer Acetate 40 MG/ML SOSY Inject 40 mg into the skin 3 (three) times a week.    . metoprolol (LOPRESSOR) 50 MG tablet Take 25 mg by mouth 2 (two) times daily.      No facility-administered medications prior to visit.     PAST MEDICAL HISTORY: Past Medical History:  Diagnosis Date  . Arthritis    "legs sometimes" (02/03/2014)  . CKD (chronic kidney disease) stage 3, GFR 30-59 ml/min (HCC) 02/04/2014  . Claudication ([place])   . GERD (gastroesophageal reflux disease)   . Hyperlipidemia   . Hypertension   . Multiple sclerosis ([place]) 10/23/2018   recently dx by dr Leta Baptist   . Neuromuscular disorder ([place])   . PAD (peripheral artery disease) ([place])   . Tobacco abuse    patient says she stopped smoking June 2015    PAST SURGICAL HISTORY: Past Surgical History:  Procedure Laterality Date  . LOWER  EXTREMITY ANGIOGRAM N/A 02/03/2014   Procedure: LOWER EXTREMITY ANGIOGRAM;  Surgeon: Lorretta Harp, MD;  Location: [facility] CATH LAB;  Service: Cardiovascular;  Laterality: N/A;  . PERIPHERAL ATHRECTOMY Right 02/03/2014   w/stent SFA    FAMILY HISTORY: Family History  Problem Relation Age of Onset  . Heart disease Mother   . Arthritis Father   . Heart attack Brother     SOCIAL HISTORY: Social History   Socioeconomic History  . Marital status: Single    Spouse name: Not on  file  . Number of children: Not on file  . Years of education: Not on file  . Highest education level: Not on file  Occupational History  . Not on file  Social Needs  . Financial resource strain: Not on file  . Food insecurity    Worry: Not on file    Inability: Not on file  . Transportation needs    Medical: Not on file    Non-medical: Not on file  Tobacco Use  . Smoking status: Current Some Day Smoker    Packs/day: 0.12    Years: 18.00    Pack years: 2.16    Types: Cigarettes  . Smokeless tobacco: Never Used  . Tobacco comment: < 1 pack per week  Substance and Sexual Activity  . Alcohol use: No    Comment: quit 5 yrs ago,  2014  . Drug use: Never  . Sexual activity: Not Currently  Lifestyle  . Physical activity    Days per week: Not on file    Minutes per session: Not on file  . Stress: Not at all  Relationships  . Social Herbalist on phone: Not on file    Gets together: Not on file    Attends religious service: Not on file    Active member of club or organization: Not on file    Attends meetings of clubs or organizations: Not on file    Relationship status: Not on file  . Intimate partner violence    Fear of current or ex partner: Not on file    Emotionally abused: Not on file    Physically abused: Not on file    Forced sexual activity: Not on file  Other Topics Concern  . Not on file  Social History Narrative   Lives wih son,  Works at [employer]  AD.  Children 4.  Caffeine, drinks iced tea, and occ soda.     PHYSICAL EXAM  GENERAL EXAM/CONSTITUTIONAL: Vitals:  Vitals:   09/10/19 1132  BP: (!) 181/81  Pulse: (!) 57  Temp: (!) 97.1 F (36.2 C)  Weight: 173 lb 9.6 oz (78.7 kg)  Height: '5\' 11"'  (1.803 m)   Body mass index is 24.21 kg/m. Wt Readings from Last 3 Encounters:  09/10/19 173 lb 9.6 oz (78.7 kg)  03/05/19 173 lb 3.2 oz (78.6 kg)  10/23/18 173 lb 6.4 oz (78.7 kg)    Patient is in no distress; well developed,  nourished and groomed; neck is supple  CARDIOVASCULAR:  Examination of carotid arteries is normal; no carotid bruits  Regular rate and rhythm, no murmurs  Examination of peripheral vascular system by observation and palpation is normal  EYES:  Ophthalmoscopic exam of optic discs and posterior segments is normal; no papilledema or hemorrhages No exam data present  MUSCULOSKELETAL:  Gait, strength, tone, movements noted in Neurologic exam below  NEUROLOGIC: MENTAL STATUS:  No flowsheet data found.  awake, alert, oriented to  person, place and time  recent and remote memory intact  normal attention and concentration  language fluent, comprehension intact, naming intact  fund of knowledge appropriate  CRANIAL NERVE:   2nd - no papilledema on fundoscopic exam  2nd, 3rd, 4th, 6th - pupils equal and reactive to light, visual fields full to confrontation, extraocular muscles intact, no nystagmus  5th - facial sensation symmetric  7th - facial strength symmetric  8th - hearing intact  9th - palate elevates symmetrically, uvula midline  11th - shoulder shrug symmetric  12th - tongue protrusion midline  MOTOR:   BUE 5/5, normal tone  BLE --> hip flexion 3; knee ext/flex (right 3, left 4)  RIGHT >> LEFT LEG INCREASED TONE THROUGHOUT   SENSORY:   DECR IN RIGHT FOOT  OTHER WISE normal and symmetric to light touch, temperature, vibration  COORDINATION:   finger-nose-finger, fine finger movements normal  REFLEXES:   deep tendon reflexes --> BUE 2; KNEES 3, ANKLES 1  GAIT/STATION:   SEVERE SPASTIC GAIT; UNSTEADY; USES SINGLE POINT CANE     DIAGNOSTIC DATA (LABS, IMAGING, TESTING) - I reviewed patient records, labs, notes, testing and imaging myself where available.  Lab Results  Component Value Date   WBC 6.1 10/23/2018   HGB 13.1 10/23/2018   HCT 39.5 10/23/2018   MCV 84 10/23/2018   PLT 238 10/23/2018      Component Value Date/Time   NA  144 10/23/2018 1121   K 4.6 10/23/2018 1121   CL 107 (H) 10/23/2018 1121   CO2 23 10/23/2018 1121   GLUCOSE 108 (H) 10/23/2018 1121   GLUCOSE 90 03/18/2016 1515   BUN 14 10/23/2018 1121   CREATININE 1.26 (H) 10/23/2018 1121   CREATININE 1.26 (H) 03/18/2016 1515   CALCIUM 9.6 10/23/2018 1121   PROT 6.9 10/23/2018 1121   ALBUMIN 4.3 10/23/2018 1121   AST 17 10/23/2018 1121   ALT 10 10/23/2018 1121   ALKPHOS 92 10/23/2018 1121   BILITOT 0.4 10/23/2018 1121   GFRNONAA 46 (L) 10/23/2018 1121   GFRAA 53 (L) 10/23/2018 1121   Lab Results  Component Value Date   CHOL 147 03/18/2016   HDL 41 (L) 03/18/2016   LDLCALC 84 03/18/2016   TRIG 109 03/18/2016   CHOLHDL 3.6 03/18/2016   No results found for: HGBA1C No results found for: VITAMINB12 Lab Results  Component Value Date   TSH 1.83 03/18/2016    05/02/18 Labs (Requested and reviewed from Dr. Delilah Shan office):  ANA - negative CRP 0.9 ESR 11 TSH 1.91 CCP < 16 RF < 14  07/17/18 MRI thoracic spine (with and without) [I reviewed images myself and agree with interpretation. -VRP]  - Mild disc bulging at T2-3, T5-6, T7-8. No spinal stenosis or foraminal narrowing.  - No intrinsic, compressive or abnormal enhancing spinal cord lesions. - Incidental right renal cyst (2.5cm).   07/17/18 MRI lumbar spine (without) [I reviewed images myself and agree with interpretation. -VRP]  - At L4-5: disc bulging and facet hypertrophy and ligamentum flavum hypertrophy with mild spinal stenosis and moderate biforaminal stenosis - At L3-4: disc bulging and facet hypertrophy with mild spinal stenosis and mild biforaminal stenosis - At L2-3: disc bulging and facet hypertrophy with mild biforaminal stenosis  10/18/18 MRI brain (with and without) [I reviewed images myself and agree with interpretation. -VRP]  - Multiple round and ovoid, confluent, periventricular, subcortical, cerebellar T2 hyperintensities.  Some of these are hypointense on T1 views. No  abnormal lesions are seen  on post contrast views. Considerations include autoimmune, inflammatory, demyelinating, post-infectious, or ischemic etiologies.  - See MRI cervical spine results from same day.  The presence of spinal cord lesions raises possibility of demyelinating disease.  10/18/18 MRI cervical (with and without) [I reviewed images myself and agree with interpretation. -VRP]  - The spinal cord is notable for multiple T2 and STIR hyperintense lesions at C2, C3, C5 and T2.  In combination with MRI brain findings from same day, this raises possibility of chronic demyelinating plaques.  Other autoimmune and inflammatory or parainfectious conditions are also possible. - Mild degenerative changes as noted above.    ASSESSMENT AND PLAN  63 y.o. year old female here with progressive muscle stiffness, gait difficulty, lower extremely weakness since 2016; history, exam and MRI consistent with RRMS.    Dx: CNS autoimmune / inflamm (brain, cervical)  1. MS (multiple sclerosis) ([place])   2. Gait difficulty      PLAN:  MULTIPLE SCLEROSIS (RRMS; brain and spinal cord lesions) - LEG STIFFNESS / WEAKNESS (stable) - on generic copaxone; doing well; may consider ocrevus in future (after COVID pandemic) - optimize nutrition, exercise, sleep - stop smoking - optimize vitamin D levels - consider muscle relaxer in future  HYPERTENSION (worsened) - continue BP meds; follow up with PCP  Return in about 9 months (around 06/09/2020).    Penni Bombard, MD 8/34/1962, 22:97 AM Certified in Neurology, Neurophysiology and Neuroimaging  [facility] [address] [place], [place] [address] [phone]

## 2019-10-01 ENCOUNTER — Encounter (HOSPITAL_COMMUNITY): Payer: BLUE CROSS/BLUE SHIELD

## 2019-10-07 ENCOUNTER — Other Ambulatory Visit: Payer: Self-pay | Admitting: Cardiovascular Disease

## 2019-10-07 DIAGNOSIS — I739 Peripheral vascular disease, unspecified: Secondary | ICD-10-CM

## 2019-10-11 ENCOUNTER — Ambulatory Visit (HOSPITAL_COMMUNITY): Payer: BLUE CROSS/BLUE SHIELD

## 2019-11-06 ENCOUNTER — Ambulatory Visit (HOSPITAL_COMMUNITY)
Admission: RE | Admit: 2019-11-06 | Payer: BLUE CROSS/BLUE SHIELD | Source: Ambulatory Visit | Attending: Cardiovascular Disease | Admitting: Cardiovascular Disease

## 2019-11-11 ENCOUNTER — Telehealth: Payer: Self-pay | Admitting: Diagnostic Neuroimaging

## 2019-11-11 NOTE — Telephone Encounter (Signed)
Patient called in regards to her paperwork that she dropped off today for her employer. Patient states she would like for the paperwork to write her off for 11/24 until January. Please follow up.

## 2019-11-12 NOTE — Telephone Encounter (Addendum)
Received [employer] ADA Accomodat[employer] Request Form and [employer] Leave/[employer] for Duty form for patient. Called patient to discuss. She stated that she works at [employer] so is around a lot of people. This increases her exposure to Covid. She's requesting leave from work 11/12/2019 through 01/12/2020. She understands that the form will have to be completed by MD before her return to work. I advised that may mean she has to come in for FU with MD or NP. I informed her that her forms will be filled out this week. She verbalized understanding, appreciation. Forms on Dr [employer] desk for review, completion, signature.

## 2019-11-12 NOTE — Telephone Encounter (Signed)
[employer] forms completed, signed, sent to MR for processing.

## 2019-12-05 ENCOUNTER — Telehealth: Payer: Self-pay | Admitting: *Deleted

## 2019-12-05 NOTE — Telephone Encounter (Signed)
Pt called, she needs the leave end date change to 02/12/20 pt continuous leave needs to be 12 weeks per [employer].

## 2019-12-05 NOTE — Telephone Encounter (Signed)
Date changed per patient's request; signed and dated the change. Sent to medical records to be processed.

## 2019-12-30 ENCOUNTER — Telehealth: Payer: Self-pay | Admitting: *Deleted

## 2019-12-30 NOTE — Telephone Encounter (Signed)
This request has received a favorable outcome and is approved. Please note any additional information provided by [employer] at the bottom of your screen. You will also receive a faxed copy of the determination. If you have any questions please contact [employer] at [phone]. PA Case: [id number], Status: Approved, Coverage Starts on: 12/30/2019 12:00:00 AM, Coverage Ends on: 12/29/2020 12:00:00 AM

## 2019-12-30 NOTE — Telephone Encounter (Addendum)
Started PA for glatiramer acetate on CMM, key: [id number], dx G 35. Per CMM, faxed last office note to support reason she needs to be on Copaxone 40 mg 3 x wekly , not 20 mg daily. [employer] has received your information, and the request will be reviewed. You may close this dialog, return to your dashboard, and perform other tasks.  You will receive an electronic determination in CoverMyMeds. You can see the latest determination by locating this request on your dashboard or by reopening this request. You will also receive a faxed copy of the determination. If you have any questions please contact [employer] at [phone].

## 2020-01-01 NOTE — Telephone Encounter (Addendum)
Refaxed papers to [facility] with star at end date which is noted on page 2&3. Cover sheet attached with note to same. Faxed to [phone], received confirmation of successful fax.  Called patient and LVM to make her aware papers were faxed again. Left # for questions.

## 2020-01-01 NOTE — Telephone Encounter (Signed)
Patient called in and stated her employer is stating they never received the end date on her FMLA paperwork

## 2020-01-07 ENCOUNTER — Telehealth: Payer: Self-pay

## 2020-01-07 ENCOUNTER — Encounter: Payer: Self-pay | Admitting: Cardiology

## 2020-01-07 ENCOUNTER — Telehealth (INDEPENDENT_AMBULATORY_CARE_PROVIDER_SITE_OTHER): Payer: 59 | Admitting: Cardiology

## 2020-01-07 VITALS — Ht 70.5 in | Wt 173.0 lb

## 2020-01-07 DIAGNOSIS — I739 Peripheral vascular disease, unspecified: Secondary | ICD-10-CM

## 2020-01-07 NOTE — Telephone Encounter (Signed)
Contacted patient to discuss AVS Instructions.  Left message for patient with Luke's recommendations from today's virtual office visit. Informed patient that someone from the scheduling dept will be in contact with them to schedule their follow up appt and vascular ultrasound on her legs. Message sent to scheduling and medical records to print and mail AVS  to patient.

## 2020-01-07 NOTE — Patient Instructions (Addendum)
Medication Instructions:  HOLD Lipitor for 4 weeks to see if the leg pain gets; if the pain does not get better restart the Lipitor *If you need a refill on your cardiac medications before your next appointment, please call your pharmacy*  Lab Work: None  If you have labs (blood work) drawn today and your tests are completely normal, you will receive your results only by: Marland Kitchen MyChart Message (if you have MyChart) OR . A paper copy in the mail If you have any lab test that is abnormal or we need to change your treatment, we will call you to review the results.  Testing/Procedures: Your physician has requested that you have a lower extremity arterial exercise duplex. During this test, exercise and ultrasound are used to evaluate arterial blood flow in the legs. Allow one hour for this exam. There are no restrictions or special instructions.  Follow-Up: At [facility], you and your health needs are our priority.  As part of our continuing mission to provide you with exceptional heart care, we have created designated Provider Care Teams.  These Care Teams include your primary Cardiologist (physician) and Advanced Practice Providers (APPs -  Physician Assistants and Nurse Practitioners) who all work together to provide you with the care you need, when you need it.  Your next appointment:   5 month(s)  The format for your next appointment:   In Person  Provider:   Nanetta Batty, MD  Other Instructions

## 2020-01-07 NOTE — Telephone Encounter (Signed)

## 2020-01-07 NOTE — Progress Notes (Signed)
Virtual Visit via Telephone Note   This visit type was conducted due to national recommendations for restrictions regarding the COVID-19 Pandemic (e.g. social distancing) in an effort to limit this patient's exposure and mitigate transmission in our community.  Due to her co-morbid illnesses, this patient is at least at moderate risk for complications without adequate follow up.  This format is felt to be most appropriate for this patient at this time.  The patient did not have access to video technology/had technical difficulties with video requiring transitioning to audio format only (telephone).  All issues noted in this document were discussed and addressed.  No physical exam could be performed with this format.  Please refer to the patient's chart for her  consent to telehealth for [facility].   Date:  01/07/2020   ID:  Ana Edwards, DOB [birth year], MRN [id number]  Patient Location: Home Provider Location: Home  PCP:  Orpah Cobb, MD  Cardiologist:  Nanetta Batty, MD  Electrophysiologist:  None   Evaluation Performed:  Follow-Up Visit  Chief Complaint:  Leg aches  History of Present Illness:    Ana Edwards is a pleasant 64 y.o. female with a history of peripheral vascular disease.  She has been evaluated in the past.  In 2015 she underwent peripheral angiogram which showed bilateral SFA disease.  She had an occluded left SFA and a high-grade right SFA.  She underwent intervention in February 2015 with resolution of her claudication.  As best we can tell she is asymptomatic on the left.  Other medical issues include dyslipidemia and treated hypertension.  She also has multiple sclerosis.  Patient was contacted today for routine follow-up.  She says she has been doing well.  She did tell me that her legs "ache".  This does not sound like claudication.  She has been attributing it to her multiple sclerosis.  I suggested she try a 4-week statin holiday to see if that made any  difference.  If her symptoms improve she will contact us and we will have to think about another agent.  If her symptoms do not improve then she knows to go back on the atorvastatin.  I will also arrange for follow-up lower extremity arterial Dopplers, these were due in October 2019.  She can see Dr. Allyson Sabal in the office in June.  The patient does not have symptoms concerning for COVID-19 infection (fever, chills, cough, or new shortness of breath).    Past Medical History:  Diagnosis Date  . Arthritis    "legs sometimes" (02/03/2014)  . CKD (chronic kidney disease) stage 3, GFR 30-59 ml/min 02/04/2014  . Claudication (HCC)   . GERD (gastroesophageal reflux disease)   . Hyperlipidemia   . Hypertension   . Multiple sclerosis ([facility]) 10/23/2018   recently dx by dr Marjory Lies   . Neuromuscular disorder (H[facility])   . PAD (peripheral artery disease) (H[facility])   . Tobacco abuse    patient says she stopped smoking June 2015   Past Surgical History:  Procedure Laterality Date  . LOWER EXTREMITY ANGIOGRAM N/A 02/03/2014   Procedure: LOWER EXTREMITY ANGIOGRAM;  Surgeon: Runell Gess, MD;  Location: [facility] CATH LAB;  Service: Cardiovascular;  Laterality: N/A;  . PERIPHERAL ATHRECTOMY Right 02/03/2014   w/stent SFA     Current Meds  Medication Sig  . amLODipine (NORVASC) 5 MG tablet Take 5 mg by mouth daily.   Marland Kitchen atorvastatin (LIPITOR) 40 MG tablet TAKE ONE TABLET BY MOUTH ONCE DAILY (Patient taking  differently: Patient only takes a few times a week)  . clopidogrel (PLAVIX) 75 MG tablet TAKE ONE TABLET BY MOUTH ONCE DAILY WITH BREAKFAST  . Glatiramer Acetate 40 MG/ML SOSY Inject 40 mg into the skin 3 (three) times a week.  . metoprolol (LOPRESSOR) 50 MG tablet Take 25 mg by mouth 2 (two) times daily.      Allergies:   Tetracyclines & related   Social History   Tobacco Use  . Smoking status: Former Smoker    Packs/day: 0.12    Years: 18.00    Pack years: 2.16    Types: Cigarettes  . Smokeless  tobacco: Never Used  . Tobacco comment: < 1 pack per week  Substance Use Topics  . Alcohol use: No    Comment: quit 5 yrs ago,  2014  . Drug use: Never     Family Hx: The patient's family history includes Arthritis in her father; Heart attack in her brother; Heart disease in her mother.  ROS:   Please see the history of present illness.    All other systems reviewed and are negative.   Prior CV studies:   The following studies were reviewed today: LEA dopplers Oct 2019  Labs/Other Tests and Data Reviewed:    EKG:  No ECG reviewed.  Recent Labs: No results found for requested labs within last 8760 hours.   Recent Lipid Panel Lab Results  Component Value Date/Time   CHOL 147 03/18/2016 03:15 PM   TRIG 109 03/18/2016 03:15 PM   HDL 41 (L) 03/18/2016 03:15 PM   CHOLHDL 3.6 03/18/2016 03:15 PM   LDLCALC 84 03/18/2016 03:15 PM    Wt Readings from Last 3 Encounters:  01/07/20 173 lb (78.5 kg)  09/10/19 173 lb 9.6 oz (78.7 kg)  03/05/19 173 lb 3.2 oz (78.6 kg)     Objective:    Vital Signs:  Ht 5' 10.5" (1.791 m)   Wt 173 lb (78.5 kg)   BMI 24.47 kg/m    VITAL SIGNS:  reviewed  ASSESSMENT & PLAN:    PVD- Bilateral SFA disease in 2015- occluded Lt SFA and high grade Rt SFA.  She is s/p Rt SFA PTA with resolution of her symptoms, she is asymptomatic on the left.   Leg pain- Try statin holiday- if she has improvement consider PCSK9 though I'm not sure she would qualify as she has no history of stroke or CAD.   Multiple sclerosis- Dr Leta Baptist follows   COVID-19 Education: The signs and symptoms of COVID-19 were discussed with the patient and how to seek care for testing (follow up with PCP or arrange E-visit).  The importance of social distancing was discussed today.  Time:   Today, I have spent 10 minutes with the patient with telehealth technology discussing the above problems.     Medication Adjustments/Labs and Tests Ordered: Current medicines are  reviewed at length with the patient today.  Concerns regarding medicines are outlined above.   Tests Ordered: No orders of the defined types were placed in this encounter.   Medication Changes: No orders of the defined types were placed in this encounter.   Follow Up:  In Person Dr Gwenlyn Found in June.  I will arrange for LEA dopplers  Signed, Kerin Ransom, PA-C  01/07/2020 2:36 PM    [facility]

## 2020-01-28 ENCOUNTER — Other Ambulatory Visit: Payer: Self-pay | Admitting: Diagnostic Neuroimaging

## 2020-02-20 ENCOUNTER — Telehealth: Payer: Self-pay | Admitting: Diagnostic Neuroimaging

## 2020-02-20 DIAGNOSIS — R269 Unspecified abnormalities of gait and mobility: Secondary | ICD-10-CM

## 2020-02-20 DIAGNOSIS — R531 Weakness: Secondary | ICD-10-CM

## 2020-02-20 DIAGNOSIS — G35 Multiple sclerosis: Secondary | ICD-10-CM

## 2020-02-20 NOTE — Telephone Encounter (Signed)
Pt called wanting to know if she is needing to get the Covid Vaccine and also she would like the provider to know that she has straighten out the Insurance issue and is wanting to know if the referral for PT can be sent out for her. Please advise.

## 2020-02-24 NOTE — Telephone Encounter (Signed)
Called patient and informed her DR [facility] advises she may get Covid vaccine, and she should also consult with her PCP for his advice. Afterwards she still needs to follow CDC guidelines; I reviewed then with her. I advised I"ll let [facility] know her insurance issue is resolved so they can call and schedule her PT. Patient verbalized understanding, appreciation. Called [facility], spoke with Wallace Cullens, informed him she has resolved insurance issue. He stated a new referral must be placed, previous was dated 03/05/2019.  PT referral placed as per previous order.

## 2020-04-10 ENCOUNTER — Other Ambulatory Visit: Payer: Self-pay

## 2020-04-10 ENCOUNTER — Ambulatory Visit: Payer: 59 | Attending: Cardiovascular Disease

## 2020-04-10 DIAGNOSIS — R293 Abnormal posture: Secondary | ICD-10-CM | POA: Diagnosis present

## 2020-04-10 DIAGNOSIS — M6281 Muscle weakness (generalized): Secondary | ICD-10-CM | POA: Diagnosis present

## 2020-04-10 DIAGNOSIS — R2689 Other abnormalities of gait and mobility: Secondary | ICD-10-CM

## 2020-04-10 DIAGNOSIS — R2681 Unsteadiness on feet: Secondary | ICD-10-CM | POA: Diagnosis present

## 2020-04-10 NOTE — Therapy (Signed)
[facility] [address] [place], [place], [address] Phone: [phone]   Fax:  [phone]  Physical Therapy Evaluation  Patient Details  Name: Ana Edwards MRN: [id number] Date of Birth: 03/06/1956 Referring Provider (PT): Joycelyn Schmid, MD   Encounter Date: 04/10/2020  PT End of Session - 04/10/20 1159    Visit Number  1    Number of Visits  10    Date for PT Re-Evaluation  06/09/20   POC for 6 weeks, Cert for 60 days   Authorization Type  [employer]    PT Start Time  [phone]    PT Stop Time  1017    PT Time Calculation (min)  43 min    Activity Tolerance  Patient tolerated treatment well    Behavior During Therapy  [facility] for tasks assessed/performed       Past Medical History:  Diagnosis Date  . Arthritis    "legs sometimes" (02/03/2014)  . CKD (chronic kidney disease) stage 3, GFR 30-59 ml/min 02/04/2014  . Claudication (HCC)   . GERD (gastroesophageal reflux disease)   . Hyperlipidemia   . Hypertension   . Multiple sclerosis (HCC) 10/23/2018   recently dx by dr Marjory Lies   . Neuromuscular disorder (HCC)   . PAD (peripheral artery disease) (HCC)   . Tobacco abuse    patient says she stopped smoking June 2015    Past Surgical History:  Procedure Laterality Date  . LOWER EXTREMITY ANGIOGRAM N/A 02/03/2014   Procedure: LOWER EXTREMITY ANGIOGRAM;  Surgeon: Runell Gess, MD;  Location: [facility] CATH LAB;  Service: Cardiovascular;  Laterality: N/A;  . PERIPHERAL ATHRECTOMY Right 02/03/2014   w/stent SFA    There were no vitals filed for this visit.   Subjective Assessment - 04/10/20 0938    Subjective  Patient reports she was diagnosed with Multiple Sclerosis in Nov 2019. Reports that she has increased stiffness in her right leg and has made it difficult to walk. Stiffness has stayed relatively the same since diagnosis. Reports it gets worse in the day when she is sitting for long periods of time. Trys  to stand and complete activities throughout the day in hopes to reduce the stiffness. Reports having a cane, that she uses when she must walk longer distances. Did not come in ambulating with the cane today. Still able to complete all her activities around the house currently, does limit her ability to walk/stand for extended periods of times. Reports that she never has pain, more so feeling of extreme stiffness. 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    Pertinent History  Arthritis, CKD stage 3, GERD, HTN, Hyperlipidemia, PAD, Multiple Sclerosis    Limitations  Standing;Walking    How long can you walk comfortably?  5-10 mins (more than a block)    Patient Stated Goals  Function better and be able to move around better    Currently in Pain?  No/denies         [facility] PT Assessment - 04/10/20 0943      Assessment   Medical Diagnosis  Multiple Sclerosis     Referring Provider (PT)  Joycelyn Schmid, MD    Onset Date/Surgical Date  10/23/18    Hand Dominance  Left    Next MD Visit  June 2021    Prior Therapy  December 2019 (for [facility] at [facility])  Precautions   Precautions  Fall      Balance Screen   Has the patient fallen in the past 6 months  No    Has the patient had a decrease in activity level because of a fear of falling?   No    Is the patient reluctant to leave their home because of a fear of falling?   No      Home Nurse, mental health  Private residence    Living Arrangements  Children   Son (73 y.o.)   Available Help at Discharge  Family    Type of Home  House    Home Access  Stairs to enter    Entrance Stairs-Number of Steps  2    Entrance Stairs-Rails  Right;Left    Home Layout  One level    Home Equipment  [place] - single point;Walker - 4 wheels      Prior Function   Level of Independence  Independent;Independent with community mobility with device    Vocation  Unemployed   due to  pandemic   Leisure  listen to music, board games, and go to concerts.       Cognition   Overall Cognitive Status  Within Functional Limits for tasks assessed      Sensation   Light Touch  Appears Intact      Posture/Postural Control   Posture/Postural Control  Postural limitations    Postural Limitations  Rounded Shoulders;Forward head      ROM / Strength   AROM / PROM / Strength  AROM;Strength;PROM      AROM   Overall AROM   Within functional limits for tasks performed    Overall AROM Comments  slight difficullty with AROM on R side due to stiffness, however full PROM    AROM Assessment Site  Knee    Right/Left Knee  Right    Right Knee Extension  0    Right Knee Flexion  96      PROM   PROM Assessment Site  Knee    Right/Left Knee  Right    Right Knee Extension  0    Right Knee Flexion  109      Strength   Strength Assessment Site  Hip;Knee;Ankle    Right/Left Hip  Right;Left    Right Hip Flexion  4+/5    Right Hip ABduction  3/5    Left Hip Flexion  5/5    Left Hip ABduction  3+/5    Right/Left Knee  Right;Left    Right Knee Flexion  4/5    Right Knee Extension  4+/5    Left Knee Flexion  4+/5    Left Knee Extension  5/5    Right/Left Ankle  Right;Left    Right Ankle Dorsiflexion  4-/5    Left Ankle Dorsiflexion  4+/5      Transfers   Transfers  Sit to Stand;Stand to Sit    Sit to Stand  6: Modified independent (Device/Increase time)    Five time sit to stand comments   21.29 secs, 5x sit <> stands    Stand to Sit  6: Modified independent (Device/Increase time)      Ambulation/Gait   Ambulation/Gait  Yes    Ambulation/Gait Assistance  5: Supervision    Ambulation/Gait Assistance Details  supv for safety    Ambulation Distance (Feet)  115 Feet    Assistive device  None    Gait Pattern  Decreased step length -  left;Decreased stride length;Trendelenburg;Decreased stance time - right;Decreased step length - right;Decreased hip/knee flexion - right;Decreased  dorsiflexion - right;Decreased weight shift to right;Poor foot clearance - right    Ambulation Surface  Level;Indoor    Gait velocity  1.48 ft/sec       Standardized Balance Assessment   Standardized Balance Assessment  Timed Up and Go Test      Timed Up and Go Test   TUG  Normal TUG    Normal TUG (seconds)  17.99    TUG Comments  w/o AD                Objective measurements completed on examination: See above findings.              PT Education - 04/10/20 1158    Education Details  Educated on evaluation findings and POC    Person(s) Educated  Patient    Methods  Explanation    Comprehension  Verbalized understanding       PT Short Term Goals - 04/10/20 1214      PT SHORT TERM GOAL #1   Title  Patient will be indepdent with initial HEP    Baseline  No HEP initiated    Time  3    Period  Weeks    Status  New    Target Date  05/01/20      PT SHORT TERM GOAL #2   Title  Patient will decrease 5x sit <> stand to <19 seconds w/ UE support to demonstrate improved BLE strength    Time  3    Period  Weeks    Status  New    Target Date  05/01/20      PT SHORT TERM GOAL #3   Title  Further Balance Test (FGA) to be assessed with LTG to be written as appropriate    Time  3    Period  Weeks    Status  New    Target Date  05/01/20        PT Long Term Goals - 04/10/20 1217      PT LONG TERM GOAL #1   Title  Patient will be indepdent with final HEP    Time  6    Period  Weeks    Status  New    Target Date  05/22/20      PT LONG TERM GOAL #2   Title  Patient will decrease 5x sit <> stand to <15 seconds w/ UE support to demonstrate improved BLE strength and decrease risk for falls    Baseline  21.29 secs    Time  6    Period  Weeks    Status  New    Target Date  05/22/20      PT LONG TERM GOAL #3   Title  Patient will decrease TUG < 14 seconds to decrease risk for falls    Baseline  17.99 secs    Time  6    Period  Weeks    Status  New     Target Date  05/22/20      PT LONG TERM GOAL #4   Title  LTG to be set for FGA    Time  6    Period  Weeks    Status  New    Target Date  05/22/20      PT LONG TERM GOAL #5   Title  Patient will improve gait speed from 1.48 ft/ second  to >2.0 ft/sec to demonstrate improve community mobiliy    Baseline  1.48 ft/sec    Time  6    Period  Weeks    Status  New    Target Date  05/22/20             Plan - 04/10/20 1201    Clinical Impression Statement  Patient is a 64 y.o. female that was referred to [facility] for weakness in her lower extremities and gait difficulties due to Multiple Sclerosis diagnosis. Patient was diagnosed in November 2019. Initiated physical therapy in December of 2019 however cancelled all appointments due to pandemic beginning. Deficits noted in today's evaluation: include gait abnormalities, decreased balance, increased risk for falls, decreased strength, and decreased ROM. With TUG and 5x sit <> stand scores with today's assessment, patient demonstrates at a high risk of falls at this time. Patient will benefit from skilled PT services to address deficits mentioned above, reduce risk for falls, and improve functional mobility.    Personal Factors and Comorbidities  Comorbidity 3+    Comorbidities  Arthritis, CKD stage 3, GERD, HTN, Hyperlipidemia, PAD, Multiple Sclerosis    Examination-Activity Limitations  Stand;Squat;Sit    Examination-Participation Restrictions  Community Activity;Yard Work    Stability/Clinical Decision Making  Evolving/Moderate complexity    Clinical Decision Making  Moderate    Rehab Potential  Good    PT Frequency  2x / week    PT Duration  3 weeks   followed by 1x/week for 3 weeks.   PT Treatment/Interventions  Gait training;Stair training;Functional mobility training;Therapeutic activities;Therapeutic exercise;Neuromuscular re-education;Patient/family education;Cryotherapy;Moist Heat;DME Instruction;Balance training;Orthotic  Fit/Training;Manual techniques;Passive range of motion    PT Next Visit Plan  Balance Assessment with Sharlene Motts or FGA, Initiate HEP    Consulted and Agree with Plan of Care  Patient       Patient will benefit from skilled therapeutic intervention in order to improve the following deficits and impairments:  Abnormal gait, Decreased balance, Decreased endurance, Decreased activity tolerance, Decreased range of motion, Hypomobility, Difficulty walking, Impaired flexibility, Decreased strength, Decreased mobility, Improper body mechanics, Pain  Visit Diagnosis: Other abnormalities of gait and mobility  Muscle weakness (generalized)  Abnormal posture  Unsteadiness on feet     Problem List Patient Active Problem List   Diagnosis Date Noted  . Hyperlipidemia 03/10/2015  . PAD (peripheral artery disease), 02/03/14 diamondback rotational atherectomy,PTA stent IDEV supera, mid-rt SFA.  Residual tot. L SFA  02/04/2014  . CKD (chronic kidney disease) stage 3, GFR 30-59 ml/min 02/04/2014  . Anemia 02/04/2014  . Claudication, lifestyle limiting 01/24/2014  . Chest pain at rest 01/09/2012    Class: Acute  . Hypertension 01/09/2012    Class: Chronic    Tempie Donning, PT, DPT 04/10/2020, 12:23 PM  [facility] [address] [place], [place], [address] Phone: [phone]   Fax:  [phone]  Name: Ana Edwards MRN: [id number] Date of Birth: [birth year]

## 2020-04-13 ENCOUNTER — Ambulatory Visit: Payer: 59

## 2020-04-13 ENCOUNTER — Other Ambulatory Visit: Payer: Self-pay

## 2020-04-13 DIAGNOSIS — R2689 Other abnormalities of gait and mobility: Secondary | ICD-10-CM

## 2020-04-13 DIAGNOSIS — M6281 Muscle weakness (generalized): Secondary | ICD-10-CM

## 2020-04-13 DIAGNOSIS — R293 Abnormal posture: Secondary | ICD-10-CM

## 2020-04-13 DIAGNOSIS — R2681 Unsteadiness on feet: Secondary | ICD-10-CM

## 2020-04-13 NOTE — Patient Instructions (Signed)
Access Code: [email or web address] URL: [email or web address] Date: 04/13/2020 Prepared by: Jethro Bastos  Exercises Supine Bridge - 1 x daily - 7 x weekly - 10 reps - 3 sets Sidelying Hip Abduction - 1 x daily - 7 x weekly - 10 reps - 3 sets Sitting Heel Slide with Towel - 1 x daily - 7 x weekly - 10 reps - 3 sets Sit to Stand with Armchair - 1 x daily - 7 x weekly - 2 sets - 10 reps

## 2020-04-13 NOTE — Therapy (Signed)
[facility] [address] [place], [place], [address] Phone: [phone]   Fax:  [phone]  Physical Therapy Treatment  Patient Details  Name: Ana Edwards MRN: [id number] Date of Birth: 1956/07/10 Referring Provider (PT): Joycelyn Schmid, MD   Encounter Date: 04/13/2020  PT End of Session - 04/13/20 0853    Visit Number  2    Number of Visits  10    Date for PT Re-Evaluation  06/09/20   POC for 6 weeks, Cert for 60 days   Authorization Type  [employer]    PT Start Time  0848    PT Stop Time  0932    PT Time Calculation (min)  44 min    Equipment Utilized During Treatment  Gait belt    Activity Tolerance  Patient tolerated treatment well    Behavior During Therapy  [facility] for tasks assessed/performed       Past Medical History:  Diagnosis Date  . Arthritis    "legs sometimes" (02/03/2014)  . CKD (chronic kidney disease) stage 3, GFR 30-59 ml/min 02/04/2014  . Claudication (HCC)   . GERD (gastroesophageal reflux disease)   . Hyperlipidemia   . Hypertension   . Multiple sclerosis (HCC) 10/23/2018   recently dx by dr Marjory Lies   . Neuromuscular disorder (HCC)   . PAD (peripheral artery disease) (HCC)   . Tobacco abuse    patient says she stopped smoking June 2015    Past Surgical History:  Procedure Laterality Date  . LOWER EXTREMITY ANGIOGRAM N/A 02/03/2014   Procedure: LOWER EXTREMITY ANGIOGRAM;  Surgeon: Runell Gess, MD;  Location:  [facility] CATH LAB;  Service: Cardiovascular;  Laterality: N/A;  . PERIPHERAL ATHRECTOMY Right 02/03/2014   w/stent SFA    There were no vitals filed for this visit.  Subjective Assessment - 04/13/20 0851    Subjective  Patient reports no new complaints since last visit, no falls. No pain currently. Reports increasing stiffness in the morning time but is slowing "loosening" up.    Pertinent History  Arthritis, CKD stage 3, GERD, HTN, Hyperlipidemia, PAD, Multiple Sclerosis     Limitations  Standing;Walking    How long can you walk comfortably?  5-10 mins (more than a block)    Patient Stated Goals  Function better and be able to move around better    Currently in Pain?  No/denies         [facility] PT Assessment - 04/13/20 0858      Functional Gait  Assessment   Gait assessed   Yes    Gait Level Surface  Walks 20 ft, slow speed, abnormal gait pattern, evidence for imbalance or deviates 10-15 in outside of the 12 in walkway width. Requires more than 7 sec to ambulate 20 ft.    Change in Gait Speed  Able to change speed, demonstrates mild gait deviations, deviates 6-10 in outside of the 12 in walkway width, or no gait deviations, unable to achieve a major change in velocity, or uses a change in velocity, or uses an assistive device.    Gait with Horizontal Head Turns  Performs head turns smoothly with slight change in gait velocity (eg, minor disruption to smooth gait path), deviates 6-10 in outside 12 in walkway width, or uses an assistive device.    Gait with Vertical Head Turns  Performs task with slight change in gait velocity (eg, minor disruption to smooth gait path), deviates 6 - 10 in outside 12 in walkway  width or uses assistive device    Gait and Pivot Turn  Turns slowly, requires verbal cueing, or requires several small steps to catch balance following turn and stop    Step Over Obstacle  Is able to step over one shoe box (4.5 in total height) but must slow down and adjust steps to clear box safely. May require verbal cueing.    Gait with Narrow Base of Support  Ambulates less than 4 steps heel to toe or cannot perform without assistance.    Gait with Eyes Closed  Walks 20 ft, uses assistive device, slower speed, mild gait deviations, deviates 6-10 in outside 12 in walkway width. Ambulates 20 ft in less than 9 sec but greater than 7 sec.    Ambulating Backwards  Walks 20 ft, slow speed, abnormal gait pattern, evidence for imbalance, deviates 10-15 in outside 12  in walkway width.    Steps  Alternating feet, must use rail.    Total Score  14    FGA comment:  14/30                    [facility] Adult PT Treatment/Exercise - 04/13/20 0936      Transfers   Transfers  Sit to Stand;Stand to Sit    Sit to Stand  6: Modified independent (Device/Increase time)    Stand to Sit  6: Modified independent (Device/Increase time)    Comments  completed sit <> stands x 10 from standard height for further improvement in BLE strength, verbal cues for improved posture with upright position      Ambulation/Gait   Ambulation/Gait  Yes    Ambulation/Gait Assistance  5: Supervision    Ambulation/Gait Assistance Details  gait around gym. verbal cues and demonstration for proper sequencing with SPC and proper positioning required. Pt able to return demonstration with improved sequencing. Patient demo decreased stability with turns and require intermittent CGA for steadying.     Ambulation Distance (Feet)  230 Feet    Assistive device  Straight cane    Gait Pattern  Decreased step length - left;Decreased stride length;Trendelenburg;Decreased stance time - right;Decreased step length - right;Decreased hip/knee flexion - right;Decreased dorsiflexion - right;Decreased weight shift to right;Poor foot clearance - right    Ambulation Surface  Level;Indoor      Exercises   Exercises  Knee/Hip      Knee/Hip Exercises: Supine   Heel Slides  AROM;AAROM;Right;1 set;10 reps    Heel Slides Limitations  completed in long sit with towel.     Bridges  Strengthening;Both;1 set;10 reps      Knee/Hip Exercises: Sidelying   Hip ABduction  Strengthening;Both;10 reps;1 set    Hip ABduction Limitations  verbal cues for form to ensure keep hips in neutral alignment due to tendencency to roll posterior              PT Education - 04/13/20 0942    Education Details  Educated on Initial HEP    Person(s) Educated  Patient    Methods  Explanation;Demonstration;Handout     Comprehension  Verbalized understanding       PT Short Term Goals - 04/13/20 0946      PT SHORT TERM GOAL #1   Title  Patient will be indepdent with initial HEP    Baseline  No HEP initiated    Time  3    Period  Weeks    Status  New    Target Date  05/01/20  PT SHORT TERM GOAL #2   Title  Patient will decrease 5x sit <> stand to <19 seconds w/ UE support to demonstrate improved BLE strength    Time  3    Period  Weeks    Status  New    Target Date  05/01/20      PT SHORT TERM GOAL #3   Title  Further Balance Test (FGA) to be assessed with LTG to be written as appropriate    Baseline  FGA assessed on 4/26 - scored 14/30    Time  3    Period  Weeks    Status  Achieved    Target Date  05/01/20        PT Long Term Goals - 04/13/20 0946      PT LONG TERM GOAL #1   Title  Patient will be independent with final HEP    Time  6    Period  Weeks    Status  New      PT LONG TERM GOAL #2   Title  Patient will decrease 5x sit <> stand to <15 seconds w/ UE support to demonstrate improved BLE strength and decrease risk for falls    Baseline  21.29 secs    Time  6    Period  Weeks    Status  New      PT LONG TERM GOAL #3   Title  Patient will decrease TUG < 14 seconds to decrease risk for falls    Baseline  17.99 secs    Time  6    Period  Weeks    Status  New      PT LONG TERM GOAL #4   Title  Patient will improve FGA from 14/30 to >/= 19/30 to demonstrate improved balance and reduced risk for falls.    Baseline  4/26 - 14/30    Time  6    Period  Weeks    Status  New      PT LONG TERM GOAL #5   Title  Patient will improve gait speed from 1.48 ft/ second to >2.0 ft/sec to demonstrate improve community mobiliy    Baseline  1.48 ft/sec    Time  6    Period  Weeks    Status  New            Plan - 04/13/20 7062    Clinical Impression Statement  Today's skilled PT session included further gait assessment with completion of FGA. Patient scoring a 14/30 on  the FGA demonstrating increased risk for falls. Patient requiring cueing for improved sequencing with SPC with ambulation. Also initiated HEP in today's session focused on improved BLE strength and improved ROM of the RLE. Patient will continue benefit from skilled PT services to address deficits and reduce fall risk.    Personal Factors and Comorbidities  Comorbidity 3+    Comorbidities  Arthritis, CKD stage 3, GERD, HTN, Hyperlipidemia, PAD, Multiple Sclerosis    Examination-Activity Limitations  Stand;Squat;Sit    Examination-Participation Restrictions  Community Activity;Yard Work    Stability/Clinical Decision Making  Evolving/Moderate complexity    Rehab Potential  Good    PT Frequency  2x / week    PT Duration  3 weeks   followed by 1x/week for 3 weeks.   PT Treatment/Interventions  Gait training;Stair training;Functional mobility training;Therapeutic activities;Therapeutic exercise;Neuromuscular re-education;Patient/family education;Cryotherapy;Moist Heat;DME Instruction;Balance training;Orthotic Fit/Training;Manual techniques;Passive range of motion    PT Next Visit Plan  Gait Training.  Trial AFOs on RLE for improved ankle stability and toe off, also try SPC with Quad Tip Attachment?    PT Home Exercise Plan  Access Code: [email or web address]    Consulted and Agree with Plan of Care  Patient       Patient will benefit from skilled therapeutic intervention in order to improve the following deficits and impairments:  Abnormal gait, Decreased balance, Decreased endurance, Decreased activity tolerance, Decreased range of motion, Hypomobility, Difficulty walking, Impaired flexibility, Decreased strength, Decreased mobility, Improper body mechanics, Pain  Visit Diagnosis: Other abnormalities of gait and mobility  Muscle weakness (generalized)  Abnormal posture  Unsteadiness on feet     Problem List Patient Active Problem List   Diagnosis Date Noted  . Hyperlipidemia 03/10/2015  . PAD  (peripheral artery disease), 02/03/14 diamondback rotational atherectomy,PTA stent IDEV supera, mid-rt SFA.  Residual tot. L SFA  02/04/2014  . CKD (chronic kidney disease) stage 3, GFR 30-59 ml/min 02/04/2014  . Anemia 02/04/2014  . Claudication, lifestyle limiting 01/24/2014  . Chest pain at rest 01/09/2012    Class: Acute  . Hypertension 01/09/2012    Class: Chronic    Tempie Donning, PT, DPT 04/13/2020, 9:48 AM  [facility] [address] [place], [place], [address] Phone: [phone]   Fax:  [phone]  Name: Ana Edwards MRN: [id number] Date of Birth: 1956-11-04

## 2020-04-16 ENCOUNTER — Other Ambulatory Visit: Payer: Self-pay

## 2020-04-16 ENCOUNTER — Ambulatory Visit: Payer: 59

## 2020-04-16 DIAGNOSIS — M6281 Muscle weakness (generalized): Secondary | ICD-10-CM

## 2020-04-16 DIAGNOSIS — R293 Abnormal posture: Secondary | ICD-10-CM

## 2020-04-16 DIAGNOSIS — R2681 Unsteadiness on feet: Secondary | ICD-10-CM

## 2020-04-16 DIAGNOSIS — R2689 Other abnormalities of gait and mobility: Secondary | ICD-10-CM | POA: Diagnosis not present

## 2020-04-16 NOTE — Patient Instructions (Signed)
Access Code: [email or web address] URL: [email or web address] Date: 04/16/2020 Prepared by: Jethro Bastos  Exercises Supine Bridge - 1 x daily - 7 x weekly - 10 reps - 3 sets Sidelying Hip Abduction - 1 x daily - 7 x weekly - 10 reps - 3 sets Sitting Heel Slide with Towel - 1 x daily - 7 x weekly - 10 reps - 3 sets Sit to Stand with Armchair - 1 x daily - 7 x weekly - 2 sets - 10 reps Seated Hamstring Stretch - 1 x daily - 7 x weekly - 1 sets - 3 reps - 30 hold Modified Thomas Stretch - 1 x daily - 7 x weekly - 1 sets - 3 reps - 30 hold Standing Gastroc Stretch at Counter - 1 x daily - 7 x weekly - 1 sets - 3 reps - 30 hold

## 2020-04-16 NOTE — Therapy (Signed)
[facility] [address] [place], [place], [address] Phone: [phone]   Fax:  [phone]  Physical Therapy Treatment  Patient Details  Name: Ana Edwards MRN: [id number] Date of Birth: 1956/06/02 Referring Provider (PT): Joycelyn Schmid, MD   Encounter Date: 04/16/2020  PT End of Session - 04/16/20 1150    Visit Number  3    Number of Visits  10    Date for PT Re-Evaluation  06/09/20   POC for 6 weeks, Cert for 60 days   Authorization Type  [employer]    PT Start Time  1145    PT Stop Time  1230    PT Time Calculation (min)  45 min    Equipment Utilized During Treatment  Gait belt    Activity Tolerance  Patient tolerated treatment well    Behavior During Therapy  [facility] for tasks assessed/performed       Past Medical History:  Diagnosis Date  . Arthritis    "legs sometimes" (02/03/2014)  . CKD (chronic kidney disease) stage 3, GFR 30-59 ml/min 02/04/2014  . Claudication (HCC)   . GERD (gastroesophageal reflux disease)   . Hyperlipidemia   . Hypertension   . Multiple sclerosis (HCC) 10/23/2018   recently dx by dr Marjory Lies   . Neuromuscular disorder (HCC)   . PAD (peripheral artery disease) (HCC)   . Tobacco abuse    patient says she stopped smoking June 2015    Past Surgical History:  Procedure Laterality Date  . LOWER EXTREMITY ANGIOGRAM N/A 02/03/2014   Procedure: LOWER EXTREMITY ANGIOGRAM;  Surgeon: Runell Gess, MD;  Location: [facility] CATH LAB;  Service: Cardiovascular;  Laterality: N/A;  . PERIPHERAL ATHRECTOMY Right 02/03/2014   w/stent SFA    There were no vitals filed for this visit.  Subjective Assessment - 04/16/20 1149    Subjective  Patient reports no new complaints, no falls. No reports of fatigue, just stiffness. reports that the HEP is going well.    Pertinent History  Arthritis, CKD stage 3, GERD, HTN, Hyperlipidemia, PAD, Multiple Sclerosis    Limitations  Standing;Walking    How  long can you walk comfortably?  5-10 mins (more than a block)    Patient Stated Goals  Function better and be able to move around better    Currently in Pain?  No/denies                       [facility] Adult PT Treatment/Exercise - 04/16/20 1246      Ambulation/Gait   Ambulation/Gait  Yes    Ambulation/Gait Assistance  5: Supervision    Ambulation/Gait Assistance Details  PT providing verbal cues for improved hip/knee flexion and toe clearance with ambulation. Supervision for safety.     Ambulation Distance (Feet)  300 Feet    Assistive device  Straight cane    Gait Pattern  Decreased step length - left;Decreased stride length;Trendelenburg;Decreased stance time - right;Decreased step length - right;Decreased hip/knee flexion - right;Decreased dorsiflexion - right;Decreased weight shift to right;Poor foot clearance - right    Ambulation Surface  Level;Indoor      Neuro Re-ed    Neuro Re-ed Details   Completed alternating marching with focus on improved AROM on RLE, 3 x 10. Patient completing with light UE support due to unsteadiness. Completed toe raises in standing, patient demo minimal toe clearance with compeltion due to DF weakness at this time.  Exercises   Exercises  Knee/Hip      Knee/Hip Exercises: Stretches   Passive Hamstring Stretch  Both;2 reps;30 seconds    Passive Hamstring Stretch Limitations  completed seated on edge of mat, verbal cues for maintaing knee extension and upright posture with completion    Hip Flexor Stretch  Right;3 reps;30 seconds    Hip Flexor Stretch Limitations  completed modified thomas stretch at edge of mat. verbal cues to slowly lower and relax RLE for improved stretch to hip flexors.     Gastroc Stretch  Both;3 reps;30 seconds    Gastroc Stretch Limitations  standing at countertop, cues to ensure maintaining knee extension on leg positioned posterior for proper stretch.              PT Education - 04/16/20 1246     Education Details  Updated HEP (see patient instructions)    Person(s) Educated  Patient    Methods  Explanation;Demonstration;Handout    Comprehension  Verbalized understanding       PT Short Term Goals - 04/13/20 0946      PT SHORT TERM GOAL #1   Title  Patient will be indepdent with initial HEP    Baseline  No HEP initiated    Time  3    Period  Weeks    Status  New    Target Date  05/01/20      PT SHORT TERM GOAL #2   Title  Patient will decrease 5x sit <> stand to <19 seconds w/ UE support to demonstrate improved BLE strength    Time  3    Period  Weeks    Status  New    Target Date  05/01/20      PT SHORT TERM GOAL #3   Title  Further Balance Test (FGA) to be assessed with LTG to be written as appropriate    Baseline  FGA assessed on 4/26 - scored 14/30    Time  3    Period  Weeks    Status  Achieved    Target Date  05/01/20        PT Long Term Goals - 04/13/20 0946      PT LONG TERM GOAL #1   Title  Patient will be independent with final HEP    Time  6    Period  Weeks    Status  New      PT LONG TERM GOAL #2   Title  Patient will decrease 5x sit <> stand to <15 seconds w/ UE support to demonstrate improved BLE strength and decrease risk for falls    Baseline  21.29 secs    Time  6    Period  Weeks    Status  New      PT LONG TERM GOAL #3   Title  Patient will decrease TUG < 14 seconds to decrease risk for falls    Baseline  17.99 secs    Time  6    Period  Weeks    Status  New      PT LONG TERM GOAL #4   Title  Patient will improve FGA from 14/30 to >/= 19/30 to demonstrate improved balance and reduced risk for falls.    Baseline  4/26 - 14/30    Time  6    Period  Weeks    Status  New      PT LONG TERM GOAL #5   Title  Patient will  improve gait speed from 1.48 ft/ second to >2.0 ft/sec to demonstrate improve community mobiliy    Baseline  1.48 ft/sec    Time  6    Period  Weeks    Status  New            Plan - 04/16/20 1254     Clinical Impression Statement  Today's skilled PT session included continued gait training with SPC. Educated patient on possible quad tip options for [facility] for improved stability. Also initiated stretching and updated HEP to include stretching exercises at home for improvements in stiffness and ROM. Due to increased stiffness, PT educated on possible referral for Aquatic therapy to help reduce stiffness and improve mobility, with patient repoting that she would think about it and provide response at next session. Patient will continue to benefit from skilled PT services to address deficits and improve mobility.    Personal Factors and Comorbidities  Comorbidity 3+    Comorbidities  Arthritis, CKD stage 3, GERD, HTN, Hyperlipidemia, PAD, Multiple Sclerosis    Examination-Activity Limitations  Stand;Squat;Sit    Examination-Participation Restrictions  Community Activity;Yard Work    Stability/Clinical Decision Making  Evolving/Moderate complexity    Rehab Potential  Good    PT Frequency  2x / week    PT Duration  3 weeks   followed by 1x/week for 3 weeks.   PT Treatment/Interventions  Gait training;Stair training;Functional mobility training;Therapeutic activities;Therapeutic exercise;Neuromuscular re-education;Patient/family education;Cryotherapy;Moist Heat;DME Instruction;Balance training;Orthotic Fit/Training;Manual techniques;Passive range of motion    PT Next Visit Plan  Gait Training. Trial AFOs on RLE for improved ankle stability and toe off, also try SPC with Quad Tip Attachment?    PT Home Exercise Plan  Access Code: [email or web address]    Recommended Other Services  Aquatic Therapy    Consulted and Agree with Plan of Care  Patient       Patient will benefit from skilled therapeutic intervention in order to improve the following deficits and impairments:  Abnormal gait, Decreased balance, Decreased endurance, Decreased activity tolerance, Decreased range of motion, Hypomobility, Difficulty walking,  Impaired flexibility, Decreased strength, Decreased mobility, Improper body mechanics, Pain  Visit Diagnosis: Other abnormalities of gait and mobility  Abnormal posture  Muscle weakness (generalized)  Unsteadiness on feet     Problem List Patient Active Problem List   Diagnosis Date Noted  . Hyperlipidemia 03/10/2015  . PAD (peripheral artery disease), 02/03/14 diamondback rotational atherectomy,PTA stent IDEV supera, mid-rt SFA.  Residual tot. L SFA  02/04/2014  . CKD (chronic kidney disease) stage 3, GFR 30-59 ml/min 02/04/2014  . Anemia 02/04/2014  . Claudication, lifestyle limiting 01/24/2014  . Chest pain at rest 01/09/2012    Class: Acute  . Hypertension 01/09/2012    Class: Chronic    Jones Bales, PT, DPT 04/16/2020, 12:58 PM  [facility] [address] [place], [place], [address] Phone: [phone]   Fax:  [phone]  Name: Ana Edwards MRN: [id number] Date of Birth: [birth year]

## 2020-04-17 ENCOUNTER — Ambulatory Visit: Payer: 59 | Admitting: Rehabilitative and Restorative Service Providers"

## 2020-04-20 ENCOUNTER — Ambulatory Visit: Payer: 59

## 2020-04-28 ENCOUNTER — Ambulatory Visit: Payer: 59 | Attending: Cardiovascular Disease

## 2020-04-28 ENCOUNTER — Other Ambulatory Visit: Payer: Self-pay

## 2020-04-28 DIAGNOSIS — R2689 Other abnormalities of gait and mobility: Secondary | ICD-10-CM | POA: Insufficient documentation

## 2020-04-28 DIAGNOSIS — R2681 Unsteadiness on feet: Secondary | ICD-10-CM | POA: Insufficient documentation

## 2020-04-28 DIAGNOSIS — R293 Abnormal posture: Secondary | ICD-10-CM | POA: Insufficient documentation

## 2020-04-28 DIAGNOSIS — M6281 Muscle weakness (generalized): Secondary | ICD-10-CM | POA: Diagnosis present

## 2020-04-28 NOTE — Therapy (Signed)
[facility] [address] [place], [place], [address] Phone: [phone]   Fax:  [phone]  Physical Therapy Treatment  Patient Details  Name: Ana Edwards MRN: [id number] Date of Birth: 01/15/1956 Referring Provider (PT): Joycelyn Schmid, MD   Encounter Date: 04/28/2020  PT End of Session - 04/28/20 1109    Visit Number  4    Number of Visits  10    Date for PT Re-Evaluation  06/09/20   POC for 6 weeks, Cert for 60 days   Authorization Type  [employer]    PT Start Time  1102    PT Stop Time  1146    PT Time Calculation (min)  44 min    Equipment Utilized During Treatment  Gait belt    Activity Tolerance  Patient tolerated treatment well    Behavior During Therapy  [facility] for tasks assessed/performed       Past Medical History:  Diagnosis Date  . Arthritis    "legs sometimes" (02/03/2014)  . CKD (chronic kidney disease) stage 3, GFR 30-59 ml/min 02/04/2014  . Claudication (HCC)   . GERD (gastroesophageal reflux disease)   . Hyperlipidemia   . Hypertension   . Multiple sclerosis (HCC) 10/23/2018   recently dx by dr Marjory Lies   . Neuromuscular disorder (HCC)   . PAD (peripheral artery disease) (HCC)   . Tobacco abuse    patient says she stopped smoking June 2015    Past Surgical History:  Procedure Laterality Date  . LOWER EXTREMITY ANGIOGRAM N/A 02/03/2014   Procedure: LOWER EXTREMITY ANGIOGRAM;  Surgeon: Runell Gess, MD;  Location: [facility] CATH LAB;  Service: Cardiovascular;  Laterality: N/A;  . PERIPHERAL ATHRECTOMY Right 02/03/2014   w/stent SFA    There were no vitals filed for this visit.  Subjective Assessment - 04/28/20 1106    Subjective  Patient reports no new issues, no falls. Reports increased stiffness today. Reports not doing HEP due to taking care of grandkids, but reports when completing they are going well. Pt reports not wanting to do aquatic therapy.    Pertinent History  Arthritis,  CKD stage 3, GERD, HTN, Hyperlipidemia, PAD, Multiple Sclerosis    Limitations  Standing;Walking    How long can you walk comfortably?  5-10 mins (more than a block)    Patient Stated Goals  Function better and be able to move around better    Currently in Pain?  No/denies                       [facility] Adult PT Treatment/Exercise - 04/28/20 1151      Bed Mobility   Bed Mobility  Supine to Sit;Sit to Supine    Supine to Sit  Independent    Sit to Supine  Independent      Transfers   Transfers  Sit to Stand;Stand to Sit    Sit to Stand  6: Modified independent (Device/Increase time)    Stand to Sit  6: Modified independent (Device/Increase time)      Ambulation/Gait   Ambulation/Gait  Yes    Ambulation/Gait Assistance  5: Supervision    Ambulation/Gait Assistance Details  Pt requiring verbal cues for improved step length, sequencing gait pattern with cane. supv for safety.  Pt currently wants to use SPC on R side, PT educating due to RLE weakness it may be beneficial to use cane on L side. Patient reporting difficulty with this and prefers to  use on R side despite PT education.     Ambulation Distance (Feet)  250 Feet    Assistive device  Straight cane   w/ quad tip attachment   Gait Pattern  Decreased step length - left;Decreased stride length;Trendelenburg;Decreased stance time - right;Decreased step length - right;Decreased hip/knee flexion - right;Decreased dorsiflexion - right;Decreased weight shift to right;Poor foot clearance - right    Ambulation Surface  Level;Indoor      Exercises   Exercises  Knee/Hip;Ankle      Knee/Hip Exercises: Stretches   Other Knee/Hip Stretches  Single Knee to Chest Stretch on BLE, 2 x 1 min each.       Knee/Hip Exercises: Supine   Other Supine Knee/Hip Exercises  Completed supine hip strengthening/stretching exercises including: bilateral knee to chest on green therapy ball, 2 x 15 reps. Including 5-10 sec hold for improved  stretching/ROM. Completed alternating trunk rotations on green therapyball, 2 x10 reps each direction. Focus on improved control of BLE.       Ankle Exercises: Seated   Other Seated Ankle Exercises  Seated ankle eversion on RLE for improved strength and ROM. Completed 2 x 10 reps with cues on isolating motion at ankle, and avoid ER of entire lower extremity. Pt demo decreased range and strength at this time.              PT Education - 04/28/20 1200    Education Details  Added to HEP (see patient instructions; new addition highlighted)    Person(s) Educated  Patient    Methods  Explanation;Demonstration;Handout    Comprehension  Verbalized understanding       PT Short Term Goals - 04/13/20 0946      PT SHORT TERM GOAL #1   Title  Patient will be indepdent with initial HEP    Baseline  No HEP initiated    Time  3    Period  Weeks    Status  New    Target Date  05/01/20      PT SHORT TERM GOAL #2   Title  Patient will decrease 5x sit <> stand to <19 seconds w/ UE support to demonstrate improved BLE strength    Time  3    Period  Weeks    Status  New    Target Date  05/01/20      PT SHORT TERM GOAL #3   Title  Further Balance Test (FGA) to be assessed with LTG to be written as appropriate    Baseline  FGA assessed on 4/26 - scored 14/30    Time  3    Period  Weeks    Status  Achieved    Target Date  05/01/20        PT Long Term Goals - 04/13/20 0946      PT LONG TERM GOAL #1   Title  Patient will be independent with final HEP    Time  6    Period  Weeks    Status  New      PT LONG TERM GOAL #2   Title  Patient will decrease 5x sit <> stand to <15 seconds w/ UE support to demonstrate improved BLE strength and decrease risk for falls    Baseline  21.29 secs    Time  6    Period  Weeks    Status  New      PT LONG TERM GOAL #3   Title  Patient will decrease TUG <  14 seconds to decrease risk for falls    Baseline  17.99 secs    Time  6    Period  Weeks     Status  New      PT LONG TERM GOAL #4   Title  Patient will improve FGA from 14/30 to >/= 19/30 to demonstrate improved balance and reduced risk for falls.    Baseline  4/26 - 14/30    Time  6    Period  Weeks    Status  New      PT LONG TERM GOAL #5   Title  Patient will improve gait speed from 1.48 ft/ second to >2.0 ft/sec to demonstrate improve community mobiliy    Baseline  1.48 ft/sec    Time  6    Period  Weeks    Status  New            Plan - 04/28/20 1201    Clinical Impression Statement  Continued gait training with SPC, patient has purchased a quad tip for [facility] and demonstrates improved stability with this addition. Still demosntrating gait deficits due to increased stiffness in today's session. Due to reports of increased stiffness compelted ther ex focused on BLE stretching and strengthening to patients tolerance. Patient does not want to participate in Aquatic therapy at this time. Pt will continue to benefit from skilled PT services to progress toward all unmet STG/LTGs.    Personal Factors and Comorbidities  Comorbidity 3+    Comorbidities  Arthritis, CKD stage 3, GERD, HTN, Hyperlipidemia, PAD, Multiple Sclerosis    Examination-Activity Limitations  Stand;Squat;Sit    Examination-Participation Restrictions  Community Activity;Yard Work    Stability/Clinical Decision Making  Evolving/Moderate complexity    Rehab Potential  Good    PT Frequency  2x / week    PT Duration  3 weeks   followed by 1x/week for 3 weeks.   PT Treatment/Interventions  Gait training;Stair training;Functional mobility training;Therapeutic activities;Therapeutic exercise;Neuromuscular re-education;Patient/family education;Cryotherapy;Moist Heat;DME Instruction;Balance training;Orthotic Fit/Training;Manual techniques;Passive range of motion    PT Next Visit Plan  Check STG's. Trial AFOs on RLE for improved ankle stability and toe off. BLE stretching/strengthening    PT Home Exercise Plan  Access  Code: [email or web address]    Consulted and Agree with Plan of Care  Patient       Patient will benefit from skilled therapeutic intervention in order to improve the following deficits and impairments:  Abnormal gait, Decreased balance, Decreased endurance, Decreased activity tolerance, Decreased range of motion, Hypomobility, Difficulty walking, Impaired flexibility, Decreased strength, Decreased mobility, Improper body mechanics, Pain  Visit Diagnosis: Other abnormalities of gait and mobility  Abnormal posture  Muscle weakness (generalized)  Unsteadiness on feet     Problem List Patient Active Problem List   Diagnosis Date Noted  . Hyperlipidemia 03/10/2015  . PAD (peripheral artery disease), 02/03/14 diamondback rotational atherectomy,PTA stent IDEV supera, mid-rt SFA.  Residual tot. L SFA  02/04/2014  . CKD (chronic kidney disease) stage 3, GFR 30-59 ml/min 02/04/2014  . Anemia 02/04/2014  . Claudication, lifestyle limiting 01/24/2014  . Chest pain at rest 01/09/2012    Class: Acute  . Hypertension 01/09/2012    Class: Chronic    Tempie Donning, PT, DPT 04/28/2020, 12:07 PM  [facility] [address] [place], [place], [address] Phone: [phone]   Fax:  [phone]  Name: SAVONNA BIRCHMEIER MRN: [id number] Date of Birth: 02-10-1956

## 2020-04-28 NOTE — Patient Instructions (Signed)
Access Code: [email or web address] URL: [email or web address] Date: 04/16/2020 Prepared by: Jethro Bastos  Exercises Supine Bridge - 1 x daily - 7 x weekly - 10 reps - 3 sets Sidelying Hip Abduction - 1 x daily - 7 x weekly - 10 reps - 3 sets Sitting Heel Slide with Towel - 1 x daily - 7 x weekly - 10 reps - 3 sets Sit to Stand with Armchair - 1 x daily - 7 x weekly - 2 sets - 10 reps Seated Hamstring Stretch - 1 x daily - 7 x weekly - 1 sets - 3 reps - 30 hold Modified Thomas Stretch - 1 x daily - 7 x weekly - 1 sets - 3 reps - 30 hold Standing Gastroc Stretch at Counter - 1 x daily - 7 x weekly - 1 sets - 3 reps - 30 hold Seated Ankle Eversion AROM - 1 x daily - 7 x weekly - 3 sets - 10 reps

## 2020-05-01 ENCOUNTER — Other Ambulatory Visit: Payer: Self-pay

## 2020-05-01 ENCOUNTER — Ambulatory Visit: Payer: 59

## 2020-05-01 DIAGNOSIS — M6281 Muscle weakness (generalized): Secondary | ICD-10-CM

## 2020-05-01 DIAGNOSIS — R2689 Other abnormalities of gait and mobility: Secondary | ICD-10-CM | POA: Diagnosis not present

## 2020-05-01 DIAGNOSIS — R2681 Unsteadiness on feet: Secondary | ICD-10-CM

## 2020-05-01 DIAGNOSIS — R293 Abnormal posture: Secondary | ICD-10-CM

## 2020-05-01 NOTE — Therapy (Signed)
[facility] [address] [place], [place], [address] Phone: [phone]   Fax:  [phone]  Physical Therapy Treatment  Patient Details  Name: Ana Edwards MRN: [id number] Date of Birth: [birth year] Referring Provider (PT): Joycelyn Schmid, MD   Encounter Date: 05/01/2020  PT End of Session - 05/01/20 0942    Visit Number  5    Number of Visits  10    Date for PT Re-Evaluation  06/09/20   POC for 6 weeks, Cert for 60 days   Authorization Type  [employer]    PT Start Time  0932    PT Stop Time  1016    PT Time Calculation (min)  44 min    Equipment Utilized During Treatment  Gait belt    Activity Tolerance  Patient tolerated treatment well    Behavior During Therapy  [facility] for tasks assessed/performed       Past Medical History:  Diagnosis Date  . Arthritis    "legs sometimes" (02/03/2014)  . CKD (chronic kidney disease) stage 3, GFR 30-59 ml/min 02/04/2014  . Claudication (HCC)   . GERD (gastroesophageal reflux disease)   . Hyperlipidemia   . Hypertension   . Multiple sclerosis (HCC) 10/23/2018   recently dx by dr Marjory Lies   . Neuromuscular disorder (HCC)   . PAD (peripheral artery disease) (HCC)   . Tobacco abuse    patient says she stopped smoking June 2015    Past Surgical History:  Procedure Laterality Date  . LOWER EXTREMITY ANGIOGRAM N/A 02/03/2014   Procedure: LOWER EXTREMITY ANGIOGRAM;  Surgeon: Runell Gess, MD;  Location: [facility] CATH LAB;  Service: Cardiovascular;  Laterality: N/A;  . PERIPHERAL ATHRECTOMY Right 02/03/2014   w/stent SFA    There were no vitals filed for this visit.  Subjective Assessment - 05/01/20 0935    Subjective  Pt reports doing well. reports less stiffness today.    Pertinent History  Arthritis, CKD stage 3, GERD, HTN, Hyperlipidemia, PAD, Multiple Sclerosis    Limitations  Standing;Walking    How long can you walk comfortably?  5-10 mins (more than a block)    Patient Stated Goals  Function better and be able to move around better    Currently in Pain?  No/denies                        [facility] Adult PT Treatment/Exercise - 05/01/20 0956      Transfers   Transfers  Sit to Stand;Stand to Sit    Sit to Stand  6: Modified independent (Device/Increase time)    Five time sit to stand comments   18.43 secs 5x sit <> stand (completed from mat w/ UE support)     Stand to Sit  6: Modified independent (Device/Increase time)      Ambulation/Gait   Ambulation/Gait  Yes    Ambulation/Gait Assistance  5: Supervision    Ambulation/Gait Assistance Details  Attempted to completed gait training with AFO in today's session, PT unable to get in pt shoe due to slide ons. Completd gait training w/ SPC with quad tip attachment. PT educating patietn to ambulate with AD positoned on L side due to improved gait pattern and posture. PT providing faciliation through hips/trunk during ambulation.     Ambulation Distance (Feet)  300 Feet    Assistive device  Straight cane    Gait Pattern  Decreased step length - left;Decreased stride length;Trendelenburg;Decreased stance time -  right;Decreased step length - right;Decreased hip/knee flexion - right;Decreased dorsiflexion - right;Decreased weight shift to right;Poor foot clearance - right    Ambulation Surface  Level;Indoor      Exercises   Exercises  Knee/Hip      Knee/Hip Exercises: Stretches   Passive Hamstring Stretch  Both;2 reps;60 seconds    Passive Hamstring Stretch Limitations  pt supine, manual stretching by PT, progressing into range    Gastroc Stretch  Both;2 reps;60 seconds    Gastroc Stretch Limitations  pt supine, manual stretching by PT, progressing into range    Other Knee/Hip Stretches  completed supine hip adductor stretch, 2 x 30 secs each into range.       Knee/Hip Exercises: Supine   Bridges  Strengthening;Both;2 sets;10 reps    Bridges Limitations  completed in supine with PT  providing assistance for proper positioning of R ankle due to inversion positioning.     Other Supine Knee/Hip Exercises  Completed supine bilateral knee to chest on blue peanut ball, 2 x 5 reps including 5 second hold for improved strengthening/stretching.                PT Short Term Goals - 05/01/20 8144      PT SHORT TERM GOAL #1   Title  Patient will be indepdent with initial HEP    Baseline  Pt reports independence with initial HEP, going well.    Time  3    Period  Weeks    Status  Achieved    Target Date  05/01/20      PT SHORT TERM GOAL #2   Title  Patient will decrease 5x sit <> stand to <19 seconds w/ UE support to demonstrate improved BLE strength    Baseline  5/14 - 18.43 secs    Time  3    Period  Weeks    Status  Achieved    Target Date  05/01/20      PT SHORT TERM GOAL #3   Title  Further Balance Test (FGA) to be assessed with LTG to be written as appropriate    Baseline  FGA assessed on 4/26 - scored 14/30    Time  3    Period  Weeks    Status  Achieved    Target Date  05/01/20        PT Long Term Goals - 04/13/20 0946      PT LONG TERM GOAL #1   Title  Patient will be independent with final HEP    Time  6    Period  Weeks    Status  New      PT LONG TERM GOAL #2   Title  Patient will decrease 5x sit <> stand to <15 seconds w/ UE support to demonstrate improved BLE strength and decrease risk for falls    Baseline  21.29 secs    Time  6    Period  Weeks    Status  New      PT LONG TERM GOAL #3   Title  Patient will decrease TUG < 14 seconds to decrease risk for falls    Baseline  17.99 secs    Time  6    Period  Weeks    Status  New      PT LONG TERM GOAL #4   Title  Patient will improve FGA from 14/30 to >/= 19/30 to demonstrate improved balance and reduced risk for falls.    Baseline  4/26 - 14/30    Time  6    Period  Weeks    Status  New      PT LONG TERM GOAL #5   Title  Patient will improve gait speed from 1.48 ft/ second  to >2.0 ft/sec to demonstrate improve community mobiliy    Baseline  1.48 ft/sec    Time  6    Period  Weeks    Status  New            Plan - 05/01/20 1023    Clinical Impression Statement  Attempted gait training with AFO, however unable to complete due to patient shoewear and will attempt next session. Gait training focused on PT education of impotance of placing SPC on L side for improved gait pattern and posture. Continued therex focused on BLE stretching/strengthening with patient tolerating treatment well. Assessed STG's will patient meeting all. Pt will continue to benefit from skilled PT services to progress toward all unmet LTGs.    Personal Factors and Comorbidities  Comorbidity 3+    Comorbidities  Arthritis, CKD stage 3, GERD, HTN, Hyperlipidemia, PAD, Multiple Sclerosis    Examination-Activity Limitations  Stand;Squat;Sit    Examination-Participation Restrictions  Community Activity;Yard Work    Stability/Clinical Decision Making  Evolving/Moderate complexity    Rehab Potential  Good    PT Frequency  2x / week    PT Duration  3 weeks   followed by 1x/week for 3 weeks.   PT Treatment/Interventions  Gait training;Stair training;Functional mobility training;Therapeutic activities;Therapeutic exercise;Neuromuscular re-education;Patient/family education;Cryotherapy;Moist Heat;DME Instruction;Balance training;Orthotic Fit/Training;Manual techniques;Passive range of motion    PT Next Visit Plan  Trial AFOs on RLE for improved ankle stability and toe off. BLE stretching/strengthening    PT Home Exercise Plan  Access Code: [email or web address]    Consulted and Agree with Plan of Care  Patient       Patient will benefit from skilled therapeutic intervention in order to improve the following deficits and impairments:  Abnormal gait, Decreased balance, Decreased endurance, Decreased activity tolerance, Decreased range of motion, Hypomobility, Difficulty walking, Impaired flexibility, Decreased  strength, Decreased mobility, Improper body mechanics, Pain  Visit Diagnosis: Other abnormalities of gait and mobility  Abnormal posture  Muscle weakness (generalized)  Unsteadiness on feet     Problem List Patient Active Problem List   Diagnosis Date Noted  . Hyperlipidemia 03/10/2015  . PAD (peripheral artery disease), 02/03/14 diamondback rotational atherectomy,PTA stent IDEV supera, mid-rt SFA.  Residual tot. L SFA  02/04/2014  . CKD (chronic kidney disease) stage 3, GFR 30-59 ml/min 02/04/2014  . Anemia 02/04/2014  . Claudication, lifestyle limiting 01/24/2014  . Chest pain at rest 01/09/2012    Class: Acute  . Hypertension 01/09/2012    Class: Chronic    Tempie Donning, PT, DPT  05/01/2020, 10:26 AM  [facility] [address] [place], [place], [address] Phone: [phone]   Fax:  [phone]  Name: KARLIN BINION MRN: [id number] Date of Birth: 09-May-1956

## 2020-05-05 ENCOUNTER — Ambulatory Visit: Payer: 59

## 2020-05-05 ENCOUNTER — Other Ambulatory Visit: Payer: Self-pay

## 2020-05-05 DIAGNOSIS — M6281 Muscle weakness (generalized): Secondary | ICD-10-CM

## 2020-05-05 DIAGNOSIS — R2681 Unsteadiness on feet: Secondary | ICD-10-CM

## 2020-05-05 DIAGNOSIS — R2689 Other abnormalities of gait and mobility: Secondary | ICD-10-CM

## 2020-05-05 DIAGNOSIS — R293 Abnormal posture: Secondary | ICD-10-CM

## 2020-05-05 NOTE — Therapy (Signed)
[facility] [address] [place], [place], [address] Phone: [phone]   Fax:  [phone]  Physical Therapy Treatment  Patient Details  Name: Ana Edwards MRN: [id number] Date of Birth: [birth year] Referring Provider (PT): Joycelyn Schmid, MD   Encounter Date: 05/05/2020  PT End of Session - 05/05/20 1019    Visit Number  6    Number of Visits  10    Date for PT Re-Evaluation  06/09/20   POC for 6 weeks, Cert for 60 days   Authorization Type  [employer]    PT Start Time  1015    PT Stop Time  1059    PT Time Calculation (min)  44 min    Equipment Utilized During Treatment  Gait belt    Activity Tolerance  Patient tolerated treatment well    Behavior During Therapy  [facility] for tasks assessed/performed       Past Medical History:  Diagnosis Date  . Arthritis    "legs sometimes" (02/03/2014)  . CKD (chronic kidney disease) stage 3, GFR 30-59 ml/min 02/04/2014  . Claudication (HCC)   . GERD (gastroesophageal reflux disease)   . Hyperlipidemia   . Hypertension   . Multiple sclerosis (HCC) 10/23/2018   recently dx by dr Marjory Lies   . Neuromuscular disorder (HCC)   . PAD (peripheral artery disease) (HCC)   . Tobacco abuse    patient says she stopped smoking June 2015    Past Surgical History:  Procedure Laterality Date  . LOWER EXTREMITY ANGIOGRAM N/A 02/03/2014   Procedure: LOWER EXTREMITY ANGIOGRAM;  Surgeon: Runell Gess, MD;  Location[facility] CATH LAB;  Service: Cardiovascular;  Laterality: N/A;  . PERIPHERAL ATHRECTOMY Right 02/03/2014   w/stent SFA    There were no vitals filed for this visit.  Subjective Assessment - 05/05/20 1018    Subjective  Patient reports that father passed away over the weekend. Reports that she has been walking alot and doing alot of activities and experiencing less stiffness today. Has not done HEP due to fathers death.    Pertinent History  Arthritis, CKD stage 3, GERD,  HTN, Hyperlipidemia, PAD, Multiple Sclerosis    Limitations  Standing;Walking    How long can you walk comfortably?  5-10 mins (more than a block)    Patient Stated Goals  Function better and be able to move around better    Currently in Pain?  No/denies                        [facility] Adult PT Treatment/Exercise - 05/05/20 1103      Ambulation/Gait   Ambulation/Gait  Yes    Ambulation/Gait Assistance  5: Supervision    Ambulation/Gait Assistance Details  verbal cues for improved toe clearance without AFO donned.     Ambulation Distance (Feet)  250 Feet    Assistive device  Straight cane   w/ quad tip attachment   Gait Pattern  Decreased step length - left;Decreased stride length;Trendelenburg;Decreased stance time - right;Decreased step length - right;Decreased hip/knee flexion - right;Decreased dorsiflexion - right;Decreased weight shift to right;Poor foot clearance - right    Ambulation Surface  Level;Indoor    Gait Comments  Completed gait training with R thuasne AFO with lateral strut x 150 ft. With AFO donned, pt demonstrates improved toe clearance and heel strike, improved ankle stability and overall improved gait pattern. Followed with completion of gait training w/o AFO donned and patient  demonstrating that she feels more stable with AFO. Pt wearing shoes that did not have laces today, PT educated to wear tennis shoes with laces at next session. Without AFO donned, patient continues to demonstrate decreased toe clearance and decreased step length and supination at ankle.  Educated patient on process of obtaining order for AFO. Will continue to gait train with AFO at next session and allow for patient to make further decision regarding ordering AFO.       Self-Care   Self-Care  Other Self-Care Comments    Other Self-Care Comments   Educated on compleiton of walking program daily, 15-20 minutes to allow for further improvements in endurance, moblity, and reduced stiffness.  Pt verbalizing agreement.        Exercises   Exercises  Lumbar;Knee/Hip      Lumbar Exercises: Stretches   Other Lumbar Stretch Exercise  seated flexion stretch with blue therapy ball, 2 x 10 reps with 10 sec hold. Verbal cues for form and to ensure proper neutral alignment.      Knee/Hip Exercises: Stretches   Other Knee/Hip Stretches  completed supine trunk rotationes with therapy ball, 2 x 10 reps in each direction w/ 5 sec hold. Cues for abdominal contraction to bring back to midline.       Knee/Hip Exercises: Supine   Other Supine Knee/Hip Exercises  completed supine isometric hamstring curl with therapy ball, 2 x 10 reps bilaterally with 5 sec hold.                PT Short Term Goals - 05/01/20 4627      PT SHORT TERM GOAL #1   Title  Patient will be indepdent with initial HEP    Baseline  Pt reports independence with initial HEP, going well.    Time  3    Period  Weeks    Status  Achieved    Target Date  05/01/20      PT SHORT TERM GOAL #2   Title  Patient will decrease 5x sit <> stand to <19 seconds w/ UE support to demonstrate improved BLE strength    Baseline  5/14 - 18.43 secs    Time  3    Period  Weeks    Status  Achieved    Target Date  05/01/20      PT SHORT TERM GOAL #3   Title  Further Balance Test (FGA) to be assessed with LTG to be written as appropriate    Baseline  FGA assessed on 4/26 - scored 14/30    Time  3    Period  Weeks    Status  Achieved    Target Date  05/01/20        PT Long Term Goals - 04/13/20 0946      PT LONG TERM GOAL #1   Title  Patient will be independent with final HEP    Time  6    Period  Weeks    Status  New      PT LONG TERM GOAL #2   Title  Patient will decrease 5x sit <> stand to <15 seconds w/ UE support to demonstrate improved BLE strength and decrease risk for falls    Baseline  21.29 secs    Time  6    Period  Weeks    Status  New      PT LONG TERM GOAL #3   Title  Patient will decrease TUG < 14  seconds to  decrease risk for falls    Baseline  17.99 secs    Time  6    Period  Weeks    Status  New      PT LONG TERM GOAL #4   Title  Patient will improve FGA from 14/30 to >/= 19/30 to demonstrate improved balance and reduced risk for falls.    Baseline  4/26 - 14/30    Time  6    Period  Weeks    Status  New      PT LONG TERM GOAL #5   Title  Patient will improve gait speed from 1.48 ft/ second to >2.0 ft/sec to demonstrate improve community mobiliy    Baseline  1.48 ft/sec    Time  6    Period  Weeks    Status  New            Plan - 05/05/20 1116    Clinical Impression Statement  Completed gait training with R thuasne AFO with lateral strut in today's session. Pt demonstrating improved gait pattern with AFO donned, and pt verbalizes improved stability. Will continue to complete further gait training with AFO in next session, and further speak with patient to determine if would like to start process to obtain personal AFO. Pt will continue to benefit from skilled PT services to progress toward unmet goals.    Personal Factors and Comorbidities  Comorbidity 3+    Comorbidities  Arthritis, CKD stage 3, GERD, HTN, Hyperlipidemia, PAD, Multiple Sclerosis    Examination-Activity Limitations  Stand;Squat;Sit    Examination-Participation Restrictions  Community Activity;Yard Work    Stability/Clinical Decision Making  Evolving/Moderate complexity    Rehab Potential  Good    PT Frequency  2x / week    PT Duration  3 weeks   followed by 1x/week for 3 weeks.   PT Treatment/Interventions  Gait training;Stair training;Functional mobility training;Therapeutic activities;Therapeutic exercise;Neuromuscular re-education;Patient/family education;Cryotherapy;Moist Heat;DME Instruction;Balance training;Orthotic Fit/Training;Manual techniques;Passive range of motion    PT Next Visit Plan  Continue gait training with R thuasne AFO. Continue BLE strengthening/stretching. Get order for AFO?     PT Home Exercise Plan  Access Code: [email or web address]    Consulted and Agree with Plan of Care  Patient       Patient will benefit from skilled therapeutic intervention in order to improve the following deficits and impairments:  Abnormal gait, Decreased balance, Decreased endurance, Decreased activity tolerance, Decreased range of motion, Hypomobility, Difficulty walking, Impaired flexibility, Decreased strength, Decreased mobility, Improper body mechanics, Pain  Visit Diagnosis: Other abnormalities of gait and mobility  Abnormal posture  Muscle weakness (generalized)  Unsteadiness on feet     Problem List Patient Active Problem List   Diagnosis Date Noted  . Hyperlipidemia 03/10/2015  . PAD (peripheral artery disease), 02/03/14 diamondback rotational atherectomy,PTA stent IDEV supera, mid-rt SFA.  Residual tot. L SFA  02/04/2014  . CKD (chronic kidney disease) stage 3, GFR 30-59 ml/min 02/04/2014  . Anemia 02/04/2014  . Claudication, lifestyle limiting 01/24/2014  . Chest pain at rest 01/09/2012    Class: Acute  . Hypertension 01/09/2012    Class: Chronic    Jones Bales, PT, DPT 05/05/2020, 11:20 AM  [facility] [address] [place], [place], [address] Phone: [phone]   Fax:  [phone]  Name: Ana Edwards MRN: [id number] Date of Birth: [birth year]

## 2020-05-15 ENCOUNTER — Ambulatory Visit: Payer: 59

## 2020-05-15 ENCOUNTER — Other Ambulatory Visit: Payer: Self-pay

## 2020-05-15 ENCOUNTER — Telehealth: Payer: Self-pay

## 2020-05-15 DIAGNOSIS — R2681 Unsteadiness on feet: Secondary | ICD-10-CM

## 2020-05-15 DIAGNOSIS — R2689 Other abnormalities of gait and mobility: Secondary | ICD-10-CM

## 2020-05-15 DIAGNOSIS — M6281 Muscle weakness (generalized): Secondary | ICD-10-CM

## 2020-05-15 DIAGNOSIS — R293 Abnormal posture: Secondary | ICD-10-CM

## 2020-05-15 NOTE — Telephone Encounter (Signed)
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  Haddy will benefit from use of Right AFO in order to improve safety with functional mobility.    If you agree, please submit request in EPIC under MD Order, Other Orders (list Right AFO in comments) or fax to [facility] at [phone].   Thank you, Adelfa Koh, PT, DPT   [facility] [address] [place], [place]  [address] Phone:  [phone] Fax:  [phone]

## 2020-05-15 NOTE — Telephone Encounter (Signed)
Orders Placed This Encounter  Procedures  . For home use only DME Other see comment    Right AFO in order to improve safety with functional mobility    Order Specific Question:   Length of Need    Answer:   12 Months    Suanne Marker, MD 05/15/2020, 1:02 PM Certified in Neurology, Neurophysiology and Neuroimaging  [facility] [address], [address] [place], [place] [address] [phone]

## 2020-05-15 NOTE — Therapy (Signed)
[facility] [address] [place], [place], [address] Phone: [phone]   Fax:  [phone]  Physical Therapy Treatment  Patient Details  Name: Ana Edwards MRN: [id number] Date of Birth: [birth year] Referring Provider (PT): Andrey Spearman, MD   Encounter Date: 05/15/2020  PT End of Session - 05/15/20 1010    Visit Number  7    Number of Visits  10    Date for PT Re-Evaluation  06/09/20   POC for 6 weeks, Cert for 60 days   Authorization Type  [employer]    PT Start Time  [phone]    PT Stop Time  1014    PT Time Calculation (min)  43 min    Equipment Utilized During Treatment  Gait belt    Activity Tolerance  Patient tolerated treatment well    Behavior During Therapy  [facility] for tasks assessed/performed       Past Medical History:  Diagnosis Date  . Arthritis    "legs sometimes" (02/03/2014)  . CKD (chronic kidney disease) stage 3, GFR 30-59 ml/min 02/04/2014  . Claudication ([place])   . GERD (gastroesophageal reflux disease)   . Hyperlipidemia   . Hypertension   . Multiple sclerosis ([place]) 10/23/2018   recently dx by dr Leta Baptist   . Neuromuscular disorder ([place])   . PAD (peripheral artery disease) ([place])   . Tobacco abuse    patient says she stopped smoking June 2015    Past Surgical History:  Procedure Laterality Date  . LOWER EXTREMITY ANGIOGRAM N/A 02/03/2014   Procedure: LOWER EXTREMITY ANGIOGRAM;  Surgeon: Lorretta Harp, MD;  Location: [facility] CATH LAB;  Service: Cardiovascular;  Laterality: N/A;  . PERIPHERAL ATHRECTOMY Right 02/03/2014   w/stent SFA    There were no vitals filed for this visit.  Subjective Assessment - 05/15/20 0935    Subjective  Patient reports having a busy week with burying father. Reports he was feeling weak this morning and believes it is due to being busy. Still has not been doing HEP frequently.    Pertinent History  Arthritis, CKD stage 3, GERD, HTN, Hyperlipidemia, PAD,  Multiple Sclerosis    Limitations  Standing;Walking    How long can you walk comfortably?  5-10 mins (more than a block)    Patient Stated Goals  Function better and be able to move around better    Currently in Pain?  No/denies                        [facility] Adult PT Treatment/Exercise - 05/15/20 0953      Ambulation/Gait   Ambulation/Gait  Yes    Ambulation/Gait Assistance  5: Supervision    Ambulation/Gait Assistance Details  verbal cues for sequencing with SPC. Patient requiring verbal cues for [facility] placement on L side as patient continues to ambulate with placed on weaker side.     Ambulation Distance (Feet)  200 Feet    Assistive device  Straight cane   w/ quad tip attachement   Gait Pattern  Decreased step length - left;Decreased stride length;Trendelenburg;Decreased stance time - right;Decreased step length - right;Decreased hip/knee flexion - right;Decreased dorsiflexion - right;Decreased weight shift to right;Poor foot clearance - right    Ambulation Surface  Level;Indoor    Gait Comments  Patient forgot to wear appropriate shoes for therapy session, and unable to continue gait training with R thuasne AFO with lateral strut in today's session.  High Level Balance   High Level Balance Activities  Marching forwards    High Level Balance Comments  Completed forward marching in // bars with light UE support, focus on improved hip/knee flexion with completion.      Self-Care   Self-Care  Other Self-Care Comments    Other Self-Care Comments   Educated on process of obtaining AFO. Patient verbalizing that she would to percede with obtaining AFO at this time.       Exercises   Exercises  Knee/Hip      Knee/Hip Exercises: Aerobic   Nustep  completed Nustep 2 x 5 mins on Level 3 with BUE/BLE for improved BLE ROM and strengthening. Rest break required between sets due increased LE fatigue. Verbal cues to keep steps per minute > 50.       Knee/Hip Exercises:  Standing   Lateral Step Up  Both;2 sets;10 reps;Hand Hold: 2;Step Height: 6"    Lateral Step Up Limitations  completed w/ UE in // bars. PT providing vebral cues and tactile cues for decreased circumduction for completion    Forward Step Up  Both;2 sets;10 reps;Hand Hold: 2;Step Height: 6"    Forward Step Up Limitations  completed w/ UE in // bars. verbal cues for foot placement.              PT Education - 05/15/20 1010    Education Details  Educated on process of obtaining AFO    Person(s) Educated  Patient    Methods  Explanation    Comprehension  Verbalized understanding       PT Short Term Goals - 05/01/20 0937      PT SHORT TERM GOAL #1   Title  Patient will be indepdent with initial HEP    Baseline  Pt reports independence with initial HEP, going well.    Time  3    Period  Weeks    Status  Achieved    Target Date  05/01/20      PT SHORT TERM GOAL #2   Title  Patient will decrease 5x sit <> stand to <19 seconds w/ UE support to demonstrate improved BLE strength    Baseline  5/14 - 18.43 secs    Time  3    Period  Weeks    Status  Achieved    Target Date  05/01/20      PT SHORT TERM GOAL #3   Title  Further Balance Test (FGA) to be assessed with LTG to be written as appropriate    Baseline  FGA assessed on 4/26 - scored 14/30    Time  3    Period  Weeks    Status  Achieved    Target Date  05/01/20        PT Long Term Goals - 04/13/20 0946      PT LONG TERM GOAL #1   Title  Patient will be independent with final HEP    Time  6    Period  Weeks    Status  New      PT LONG TERM GOAL #2   Title  Patient will decrease 5x sit <> stand to <15 seconds w/ UE support to demonstrate improved BLE strength and decrease risk for falls    Baseline  21.29 secs    Time  6    Period  Weeks    Status  New      PT LONG TERM GOAL #3   Title  Patient will decrease TUG < 14 seconds to decrease risk for falls    Baseline  17.99 secs    Time  6    Period  Weeks     Status  New      PT LONG TERM GOAL #4   Title  Patient will improve FGA from 14/30 to >/= 19/30 to demonstrate improved balance and reduced risk for falls.    Baseline  4/26 - 14/30    Time  6    Period  Weeks    Status  New      PT LONG TERM GOAL #5   Title  Patient will improve gait speed from 1.48 ft/ second to >2.0 ft/sec to demonstrate improve community mobiliy    Baseline  1.48 ft/sec    Time  6    Period  Weeks    Status  New            Plan - 05/15/20 1019    Clinical Impression Statement  Today's skilled PT session included NMR and therapeutic exercises targeted on improved BLE ROM and strength. Gait trianing completed w/o AFO today due to patient not wearing appropraite shoes to therapy session. PT educating on process of obtaining AFO, and will submit for order. Pt will continue to benefit from skilled PT services to address deficits and progress toward goals.    Personal Factors and Comorbidities  Comorbidity 3+    Comorbidities  Arthritis, CKD stage 3, GERD, HTN, Hyperlipidemia, PAD, Multiple Sclerosis    Examination-Activity Limitations  Stand;Squat;Sit    Examination-Participation Restrictions  Community Activity;Yard Work    Stability/Clinical Decision Making  Evolving/Moderate complexity    Rehab Potential  Good    PT Frequency  2x / week    PT Duration  3 weeks   followed by 1x/week for 3 weeks.   PT Treatment/Interventions  Gait training;Stair training;Functional mobility training;Therapeutic activities;Therapeutic exercise;Neuromuscular re-education;Patient/family education;Cryotherapy;Moist Heat;DME Instruction;Balance training;Orthotic Fit/Training;Manual techniques;Passive range of motion    PT Next Visit Plan  Continue gait training with R thuasne AFO. Continue BLE strengthening/stretching. Did get appointment scheduled with [facility]?    PT Home Exercise Plan  Access Code: [email or web address]    Consulted and Agree with Plan of Care  Patient       Patient will  benefit from skilled therapeutic intervention in order to improve the following deficits and impairments:  Abnormal gait, Decreased balance, Decreased endurance, Decreased activity tolerance, Decreased range of motion, Hypomobility, Difficulty walking, Impaired flexibility, Decreased strength, Decreased mobility, Improper body mechanics, Pain  Visit Diagnosis: Other abnormalities of gait and mobility  Abnormal posture  Unsteadiness on feet  Muscle weakness (generalized)     Problem List Patient Active Problem List   Diagnosis Date Noted  . Hyperlipidemia 03/10/2015  . PAD (peripheral artery disease), 02/03/14 diamondback rotational atherectomy,PTA stent IDEV supera, mid-rt SFA.  Residual tot. L SFA  02/04/2014  . CKD (chronic kidney disease) stage 3, GFR 30-59 ml/min 02/04/2014  . Anemia 02/04/2014  . Claudication, lifestyle limiting 01/24/2014  . Chest pain at rest 01/09/2012    Class: Acute  . Hypertension 01/09/2012    Class: Chronic    Tempie Donning, PT, DPT 05/15/2020, 10:27 AM  [facility] [address] [place], [place], [address] Phone: [phone]   Fax:  [phone]  Name: Ana Edwards MRN: [id number] Date of Birth: 14-Sep-1956

## 2020-05-21 ENCOUNTER — Ambulatory Visit: Payer: 59

## 2020-05-21 NOTE — Telephone Encounter (Signed)
Order fax confirmation to neuro rehab for R AFO received.

## 2020-05-22 ENCOUNTER — Ambulatory Visit: Payer: 59 | Admitting: Rehabilitative and Restorative Service Providers"

## 2020-05-28 NOTE — Telephone Encounter (Signed)
Received, Thank you!

## 2020-05-29 ENCOUNTER — Ambulatory Visit: Payer: 59 | Attending: Cardiovascular Disease

## 2020-05-29 ENCOUNTER — Other Ambulatory Visit: Payer: Self-pay

## 2020-05-29 DIAGNOSIS — R2681 Unsteadiness on feet: Secondary | ICD-10-CM

## 2020-05-29 DIAGNOSIS — R293 Abnormal posture: Secondary | ICD-10-CM | POA: Diagnosis present

## 2020-05-29 DIAGNOSIS — R2689 Other abnormalities of gait and mobility: Secondary | ICD-10-CM | POA: Insufficient documentation

## 2020-05-29 DIAGNOSIS — M6281 Muscle weakness (generalized): Secondary | ICD-10-CM | POA: Insufficient documentation

## 2020-05-29 NOTE — Therapy (Addendum)
[facility] [address] [place], [place], [address] Phone: [phone]   Fax:  [phone]  Physical Therapy Treatment  Patient Details  Name: Ana Edwards MRN: [id number] Date of Birth: 29-Oct-1956 Referring Provider (PT): Joycelyn Schmid, MD   Encounter Date: 05/29/2020   PT End of Session - 05/29/20 1012    Visit Number 8    Number of Visits 10    Date for PT Re-Evaluation 06/09/20   POC for 6 weeks, Cert for 60 days   Authorization Type [employer]    PT Start Time 0932    PT Stop Time 1015    PT Time Calculation (min) 43 min    Equipment Utilized During Treatment Gait belt    Activity Tolerance Patient tolerated treatment well    Behavior During Therapy [facility] for tasks assessed/performed           Past Medical History:  Diagnosis Date  . Arthritis    "legs sometimes" (02/03/2014)  . CKD (chronic kidney disease) stage 3, GFR 30-59 ml/min 02/04/2014  . Claudication ([facility])   . GERD (gastroesophageal reflux disease)   . Hyperlipidemia   . Hypertension   . Multiple sclerosis ([facility]) 10/23/2018   recently dx by dr Marjory Lies   . Neuromuscular disorder ([facility])   . PAD (peripheral artery disease) ([facility])   . Tobacco abuse    patient says she stopped smoking June 2015    Past Surgical History:  Procedure Laterality Date  . LOWER EXTREMITY ANGIOGRAM N/A 02/03/2014   Procedure: LOWER EXTREMITY ANGIOGRAM;  Surgeon: Runell Gess, MD;  Location: [facility] CATH LAB;  Service: Cardiovascular;  Laterality: N/A;  . PERIPHERAL ATHRECTOMY Right 02/03/2014   w/stent SFA    There were no vitals filed for this visit.   Subjective Assessment - 05/29/20 0942    Subjective Patient reports she has been walking alot recently. Still Reports some stiffness this morning. Reports she has started to do her HEP as more consistently.    Pertinent History Arthritis, CKD stage 3, GERD, HTN, Hyperlipidemia, PAD, Multiple Sclerosis     Limitations Standing;Walking    How long can you walk comfortably? 5-10 mins (more than a block)    Patient Stated Goals Function better and be able to move around better    Currently in Pain? No/denies              [facility] PT Assessment - 05/29/20 0956      Functional Gait  Assessment   Gait assessed  Yes    Gait Level Surface Walks 20 ft, slow speed, abnormal gait pattern, evidence for imbalance or deviates 10-15 in outside of the 12 in walkway width. Requires more than 7 sec to ambulate 20 ft.    Change in Gait Speed Able to change speed, demonstrates mild gait deviations, deviates 6-10 in outside of the 12 in walkway width, or no gait deviations, unable to achieve a major change in velocity, or uses a change in velocity, or uses an assistive device.    Gait with Horizontal Head Turns Performs head turns smoothly with slight change in gait velocity (eg, minor disruption to smooth gait path), deviates 6-10 in outside 12 in walkway width, or uses an assistive device.    Gait with Vertical Head Turns Performs task with slight change in gait velocity (eg, minor disruption to smooth gait path), deviates 6 - 10 in outside 12 in walkway width or uses assistive device    Gait and  Pivot Turn Turns slowly, requires verbal cueing, or requires several small steps to catch balance following turn and stop    Step Over Obstacle Is able to step over one shoe box (4.5 in total height) but must slow down and adjust steps to clear box safely. May require verbal cueing.    Gait with Narrow Base of Support Ambulates less than 4 steps heel to toe or cannot perform without assistance.    Gait with Eyes Closed Walks 20 ft, uses assistive device, slower speed, mild gait deviations, deviates 6-10 in outside 12 in walkway width. Ambulates 20 ft in less than 9 sec but greater than 7 sec.    Ambulating Backwards Walks 20 ft, uses assistive device, slower speed, mild gait deviations, deviates 6-10 in outside 12 in walkway  width.    Steps Alternating feet, must use rail.    Total Score 15    FGA comment: 15/30                         [facility] Adult PT Treatment/Exercise - 05/29/20 0001      Transfers   Transfers Sit to Stand;Stand to Sit    Sit to Stand 6: Modified independent (Device/Increase time)    Five time sit to stand comments  18.21 secs 5x sit <> stand w/ UE from standard height chair    Stand to Sit 6: Modified independent (Device/Increase time)      Ambulation/Gait   Ambulation/Gait Yes    Ambulation/Gait Assistance 5: Supervision    Ambulation/Gait Assistance Details verbal cues for [facility] placement. patient continues to ambulate with SPC on wrong side, leading to increased difficulty with gait. PT providing education for proper placement, with limited carryover throughout session.  Patient demo 2-3 instances of increased sway with gait today.     Ambulation Distance (Feet) 215 Feet    Assistive device Straight cane    Gait Pattern Decreased step length - left;Decreased stride length;Trendelenburg;Decreased stance time - right;Decreased step length - right;Decreased hip/knee flexion - right;Decreased dorsiflexion - right;Decreased weight shift to right;Poor foot clearance - right    Ambulation Surface Level;Indoor    Gait velocity 18.28 secs = 1.79 ft/sec    Stairs Yes    Stairs Assistance 5: Supervision    Stair Management Technique One rail Right;Alternating pattern;Forwards    Number of Stairs 4    Height of Stairs 6      Standardized Balance Assessment   Standardized Balance Assessment Timed Up and Go Test      Timed Up and Go Test   TUG Normal TUG    Normal TUG (seconds) 17.22                  PT Education - 05/29/20 1325    Education Details Educated on progress toward LTG's    Person(s) Educated Patient    Methods Explanation    Comprehension Verbalized understanding            PT Short Term Goals - 05/01/20 0937      PT SHORT TERM GOAL #1   Title  Patient will be indepdent with initial HEP    Baseline Pt reports independence with initial HEP, going well.    Time 3    Period Weeks    Status Achieved    Target Date 05/01/20      PT SHORT TERM GOAL #2   Title Patient will decrease 5x sit <> stand to <19 seconds  w/ UE support to demonstrate improved BLE strength    Baseline 5/14 - 18.43 secs    Time 3    Period Weeks    Status Achieved    Target Date 05/01/20      PT SHORT TERM GOAL #3   Title Further Balance Test (FGA) to be assessed with LTG to be written as appropriate    Baseline FGA assessed on 4/26 - scored 14/30    Time 3    Period Weeks    Status Achieved    Target Date 05/01/20             PT Long Term Goals - 05/29/20 0940      PT LONG TERM GOAL #1   Title Patient will be independent with final HEP    Baseline Patient reports independence, still continue to progress HEP as tolerated    Time 6    Period Weeks    Status On-going      PT LONG TERM GOAL #2   Title Patient will decrease 5x sit <> stand to <15 seconds w/ UE support to demonstrate improved BLE strength and decrease risk for falls    Baseline 21.29 secs, 05/29/20: 18.21 secs    Time 6    Period Weeks    Status On-going      PT LONG TERM GOAL #3   Title Patient will decrease TUG < 14 seconds to decrease risk for falls    Baseline 7.99 secs, 05/29/20: 17.22 secs    Time 6    Period Weeks    Status On-going      PT LONG TERM GOAL #4   Title Patient will improve FGA from 14/30 to >/= 19/30 to demonstrate improved balance and reduced risk for falls.    Baseline 4/26 - 14/30, 6/11 - 15/30    Time 6    Period Weeks    Status On-going      PT LONG TERM GOAL #5   Title Patient will improve gait speed from 1.48 ft/ second to >2.0 ft/sec to demonstrate improve community mobility    Baseline 1.48 ft/sec, 1.79 ft/sec    Time 6    Period Weeks    Status On-going                 Plan - 05/29/20 1303    Clinical Impression Statement  Today's skilled PT session included patient assessment of progress toward LTG's. Patient did not meet any of LTG's at this time, but did demonstrate progress toward each one. Patient still demonstrates decreased balance, difficulty walking, and increased risk for falls. PT continues to educate patient on proper placement of SPC for improved gait pattern. Pt will continue to benefit from skilled PT services to address deficits and progress toward goals.    Personal Factors and Comorbidities Comorbidity 3+    Comorbidities Arthritis, CKD stage 3, GERD, HTN, Hyperlipidemia, PAD, Multiple Sclerosis    Examination-Activity Limitations Stand;Squat;Sit    Examination-Participation Restrictions Community Activity;Yard Work    Stability/Clinical Decision Making Evolving/Moderate complexity    Rehab Potential Good    PT Frequency 2x / week    PT Duration 3 weeks   followed by 1x/week for 3 weeks.   PT Treatment/Interventions Gait training;Stair training;Functional mobility training;Therapeutic activities;Therapeutic exercise;Neuromuscular re-education;Patient/family education;Cryotherapy;Moist Heat;DME Instruction;Balance training;Orthotic Fit/Training;Manual techniques;Passive range of motion    PT Next Visit Plan Continue gait training with R thuasne AFO. Complete Re-Cert? [place]?    PT Home Exercise Plan Access Code:  [email or web address]    [email or web address] and Agree with Plan of Care Patient           Patient will benefit from skilled therapeutic intervention in order to improve the following deficits and impairments:  Abnormal gait, Decreased balance, Decreased endurance, Decreased activity tolerance, Decreased range of motion, Hypomobility, Difficulty walking, Impaired flexibility, Decreased strength, Decreased mobility, Improper body mechanics, Pain  Visit Diagnosis: Other abnormalities of gait and mobility  Abnormal posture  Unsteadiness on feet  Muscle weakness (generalized)     Problem List Patient  Active Problem List   Diagnosis Date Noted  . Hyperlipidemia 03/10/2015  . PAD (peripheral artery disease), 02/03/14 diamondback rotational atherectomy,PTA stent IDEV supera, mid-rt SFA.  Residual tot. L SFA  02/04/2014  . CKD (chronic kidney disease) stage 3, GFR 30-59 ml/min 02/04/2014  . Anemia 02/04/2014  . Claudication, lifestyle limiting 01/24/2014  . Chest pain at rest 01/09/2012    Class: Acute  . Hypertension 01/09/2012    Class: Chronic    Jones Bales, PT, DPT 05/29/2020, 1:25 PM  [facility] [address] [place], [place], [address] Phone: [phone]   Fax:  [phone]  Name: Ana Edwards MRN: [id number] Date of Birth: 1956/07/25

## 2020-06-04 ENCOUNTER — Other Ambulatory Visit: Payer: Self-pay

## 2020-06-04 ENCOUNTER — Ambulatory Visit: Payer: 59

## 2020-06-04 DIAGNOSIS — R2689 Other abnormalities of gait and mobility: Secondary | ICD-10-CM | POA: Diagnosis not present

## 2020-06-04 DIAGNOSIS — M6281 Muscle weakness (generalized): Secondary | ICD-10-CM

## 2020-06-04 DIAGNOSIS — R2681 Unsteadiness on feet: Secondary | ICD-10-CM

## 2020-06-04 DIAGNOSIS — R293 Abnormal posture: Secondary | ICD-10-CM

## 2020-06-04 NOTE — Patient Instructions (Signed)
Access Code: [email or web address] URL: [email or web address] Date: 06/04/2020 Prepared by: Jethro Bastos  Exercises Supine Bridge - 1 x daily - 7 x weekly - 10 reps - 3 sets Sidelying Hip Abduction - 1 x daily - 7 x weekly - 10 reps - 3 sets Sitting Heel Slide with Towel - 1 x daily - 7 x weekly - 10 reps - 3 sets Sit to Stand with Armchair - 1 x daily - 7 x weekly - 2 sets - 10 reps Seated Hamstring Stretch - 1 x daily - 7 x weekly - 1 sets - 3 reps - 30 hold Modified Thomas Stretch - 1 x daily - 7 x weekly - 1 sets - 3 reps - 30 hold Standing Gastroc Stretch at Counter - 1 x daily - 7 x weekly - 1 sets - 3 reps - 30 hold Seated Ankle Eversion AROM - 1 x daily - 7 x weekly - 3 sets - 10 reps Mini Squat with Counter Support - 1 x daily - 7 x weekly - 3 sets - 10 reps Standing March with Counter Support - 1 x daily - 7 x weekly - 3 sets - 10 reps Romberg Stance on Foam Pad with Head Rotation - 1 x daily - 7 x weekly - 2 sets - 10 reps Romberg Stance Eyes Closed on Foam Pad - 1 x daily - 7 x weekly - 1 sets - 3 reps - 15-30 hold

## 2020-06-04 NOTE — Therapy (Signed)
[facility] [address] [place], [place], [address] Phone: [phone]   Fax:  [phone]  Physical Therapy Treatment  Patient Details  Name: Ana Edwards MRN: [id number] Date of Birth: 11-29-1956 Referring Provider (PT): Joycelyn Schmid, MD   Encounter Date: 06/04/2020   PT End of Session - 06/04/20 1019    Visit Number 9    Number of Visits 15    Date for PT Re-Evaluation 09/02/20   POC for 6 weeks, Cert for 90 days   Authorization Type [employer]    PT Start Time 1015    PT Stop Time 1100    PT Time Calculation (min) 45 min    Equipment Utilized During Treatment Gait belt    Activity Tolerance Patient tolerated treatment well    Behavior During Therapy [facility] for tasks assessed/performed           Past Medical History:  Diagnosis Date  . Arthritis    "legs sometimes" (02/03/2014)  . CKD (chronic kidney disease) stage 3, GFR 30-59 ml/min 02/04/2014  . Claudication (HCC)   . GERD (gastroesophageal reflux disease)   . Hyperlipidemia   . Hypertension   . Multiple sclerosis (HCC) 10/23/2018   recently dx by dr Marjory Lies   . Neuromuscular disorder (HCC)   . PAD (peripheral artery disease) (HCC)   . Tobacco abuse    patient says she stopped smoking June 2015    Past Surgical History:  Procedure Laterality Date  . LOWER EXTREMITY ANGIOGRAM N/A 02/03/2014   Procedure: LOWER EXTREMITY ANGIOGRAM;  Surgeon: Runell Gess, MD;  Location: [facility] CATH LAB;  Service: Cardiovascular;  Laterality: N/A;  . PERIPHERAL ATHRECTOMY Right 02/03/2014   w/stent SFA    There were no vitals filed for this visit.   Subjective Assessment - 06/04/20 1018    Subjective Patient repots she has some soreness in her thighs with completing the exercises. Does not have as much stiffness this morning.    Pertinent History Arthritis, CKD stage 3, GERD, HTN, Hyperlipidemia, PAD, Multiple Sclerosis    Limitations Standing;Walking     How long can you walk comfortably? 5-10 mins (more than a block)    Patient Stated Goals Function better and be able to move around better    Currently in Pain? No/denies              [facility] PT Assessment - 06/04/20 1027      Assessment   Medical Diagnosis Multiple Sclerosis     Referring Provider (PT) Joycelyn Schmid, MD                         [facility] Adult PT Treatment/Exercise - 06/04/20 1027      Ambulation/Gait   Ambulation/Gait Yes    Ambulation/Gait Assistance 5: Supervision    Ambulation/Gait Assistance Details Completed gait training with R Thuasne AFO w/ Lateral Strut. Patient continues to demonstrate improved step length, improve toe clearnace, and improved gait speed with AFO donned.     Ambulation Distance (Feet) 350 Feet    Assistive device Straight cane   w/ quad tip attachment   Gait Pattern Decreased step length - left;Decreased stride length;Trendelenburg;Decreased stance time - right;Decreased step length - right;Decreased hip/knee flexion - right;Decreased dorsiflexion - right;Decreased weight shift to right;Poor foot clearance - right    Ambulation Surface Level;Indoor    Stairs Yes    Stairs Assistance 5: Supervision    Stairs Assistance Details (indicate  cue type and reason) completed stairs with R AFO donned.     Stair Management Technique One rail Right;Alternating pattern;Forwards    Number of Stairs 8    Height of Stairs 6    Gait Comments Patient reports that with AFO donned vs. not donned she can tell a diference if her stability with walking. She feels more confident with her walking with AFO donned.       Neuro Re-ed    Neuro Re-ed Details  Standing at countertop completed 2 x 10 reps of mini squats with UE support from counter. Completed 2 x 10 reps of alternating marching with UE support from counter. Patient requiring verbal cues for improved hip/knee flexion for marching, but overall demo exercises well. Updated and added to HEP to  allow for ocmpletion at home to further improve BLE ROM and strength.                Balance Exercises - 06/04/20 1106      Balance Exercises: Standing   Standing Eyes Opened Narrow base of support (BOS);Head turns;Foam/compliant surface;Other reps (comment)   2 x 10 reps of horizontal/vertical head turns   Standing Eyes Closed Narrow base of support (BOS);Foam/compliant surface;3 reps;30 secs    Other Standing Exercises Comments PT educated patient on proper completion of balance exercises at home, to complete in a corner within her home with a chair placed in front for safety.              PT Education - 06/04/20 1109    Education Details Educated on Update to HEP; Re-Cert and schedule 1x/week for 6 weeks, and scheduling [facility] to come assess for AFO.    Person(s) Educated Patient    Methods Explanation    Comprehension Verbalized understanding            PT Short Term Goals - 05/01/20 0937      PT SHORT TERM GOAL #1   Title Patient will be indepdent with initial HEP    Baseline Pt reports independence with initial HEP, going well.    Time 3    Period Weeks    Status Achieved    Target Date 05/01/20      PT SHORT TERM GOAL #2   Title Patient will decrease 5x sit <> stand to <19 seconds w/ UE support to demonstrate improved BLE strength    Baseline 5/14 - 18.43 secs    Time 3    Period Weeks    Status Achieved    Target Date 05/01/20      PT SHORT TERM GOAL #3   Title Further Balance Test (FGA) to be assessed with LTG to be written as appropriate    Baseline FGA assessed on 4/26 - scored 14/30    Time 3    Period Weeks    Status Achieved    Target Date 05/01/20             PT Long Term Goals - 05/29/20 0940      PT LONG TERM GOAL #1   Title Patient will be independent with final HEP    Baseline Patient reports independence, still continue to progress HEP as tolerated    Time 6    Period Weeks    Status On-going      PT LONG TERM GOAL #2     Title Patient will decrease 5x sit <> stand to <15 seconds w/ UE support to demonstrate improved BLE strength and decrease  risk for falls    Baseline 21.29 secs, 05/29/20: 18.21 secs    Time 6    Period Weeks    Status On-going      PT LONG TERM GOAL #3   Title Patient will decrease TUG < 14 seconds to decrease risk for falls    Baseline 7.99 secs, 05/29/20: 17.22 secs    Time 6    Period Weeks    Status On-going      PT LONG TERM GOAL #4   Title Patient will improve FGA from 14/30 to >/= 19/30 to demonstrate improved balance and reduced risk for falls.    Baseline 4/26 - 14/30, 6/11 - 15/30    Time 6    Period Weeks    Status On-going      PT LONG TERM GOAL #5   Title Patient will improve gait speed from 1.48 ft/ second to >2.0 ft/sec to demonstrate improve community mobility    Baseline 1.48 ft/sec, 1.79 ft/sec    Time 6    Period Weeks    Status On-going                 Plan - 06/04/20 1111    Clinical Impression Statement Continued completion of gait training with R Thuasne AFO with lateral strut, patient continues to demonstrate improved gait pattern with AFO donned. Completed balance and strengthening exercises in today's session, and updated HEP to patients tolerance. Educated patient we will continue for therapy for 1x/week for 6 weeks, and PT will contact [facility] to allow them to present at appointment for AFO assessment. Patient has demonstrated progress toward all LTG, however has yet to meet any at this time. Patient will continue to benefit from skilled PT services to address deficits, and improve overall functional mobility.    Personal Factors and Comorbidities Comorbidity 3+    Comorbidities Arthritis, CKD stage 3, GERD, HTN, Hyperlipidemia, PAD, Multiple Sclerosis    Examination-Activity Limitations Stand;Squat;Sit    Examination-Participation Restrictions Community Activity;Yard Work    Conservation officer, historic buildings Evolving/Moderate complexity     Rehab Potential Good    PT Frequency 1x / week    PT Duration 6 weeks    PT Treatment/Interventions Gait training;Stair training;Functional mobility training;Therapeutic activities;Therapeutic exercise;Neuromuscular re-education;Patient/family education;Cryotherapy;Moist Heat;DME Instruction;Balance training;Orthotic Fit/Training;Manual techniques;Passive range of motion    PT Next Visit Plan When is [facility] Present? How was HEP Updates? Continue strenghtening/ROM/balance exercises    PT Home Exercise Plan Access Code: [email or web address]    Consulted and Agree with Plan of Care Patient           Patient will benefit from skilled therapeutic intervention in order to improve the following deficits and impairments:  Abnormal gait, Decreased balance, Decreased endurance, Decreased activity tolerance, Decreased range of motion, Hypomobility, Difficulty walking, Impaired flexibility, Decreased strength, Decreased mobility, Improper body mechanics, Pain  Visit Diagnosis: Other abnormalities of gait and mobility  Abnormal posture  Unsteadiness on feet  Muscle weakness (generalized)     Problem List Patient Active Problem List   Diagnosis Date Noted  . Hyperlipidemia 03/10/2015  . PAD (peripheral artery disease), 02/03/14 diamondback rotational atherectomy,PTA stent IDEV supera, mid-rt SFA.  Residual tot. L SFA  02/04/2014  . CKD (chronic kidney disease) stage 3, GFR 30-59 ml/min 02/04/2014  . Anemia 02/04/2014  . Claudication, lifestyle limiting 01/24/2014  . Chest pain at rest 01/09/2012    Class: Acute  . Hypertension 01/09/2012    Class: Chronic    Tempie Donning, PT, DPT  06/04/2020, 11:16 AM  [place] [address] [place], [place], [address] Phone: [phone]   Fax:  [phone]  Name: TOPACIO CELLA MRN: [id number] Date of Birth: 11-13-1956

## 2020-06-05 ENCOUNTER — Telehealth: Payer: Self-pay

## 2020-06-05 NOTE — Telephone Encounter (Signed)
Dr. Marjory Lies,  Emalia Witkop has a face to face appointment with you on Tuesday, June 22nd. During this appointment please include details on gait abnormalities that the patient presents with and the medical necessity for a AFO on the R Lower Extremity. Please be as detailed as possible to allow for insurance coverage. [facility] will need this documentation to allow for the process of obtaining an AFO. Also, thank you for submitting the order for the R AFO.  Have a great day. Thank you,  Adelfa Koh, PT, DPT

## 2020-06-09 ENCOUNTER — Encounter: Payer: Self-pay | Admitting: Diagnostic Neuroimaging

## 2020-06-09 ENCOUNTER — Ambulatory Visit (INDEPENDENT_AMBULATORY_CARE_PROVIDER_SITE_OTHER): Payer: 59 | Admitting: Diagnostic Neuroimaging

## 2020-06-09 VITALS — BP 170/80 | HR 57 | Wt 175.0 lb

## 2020-06-09 DIAGNOSIS — G35 Multiple sclerosis: Secondary | ICD-10-CM | POA: Diagnosis not present

## 2020-06-09 DIAGNOSIS — G959 Disease of spinal cord, unspecified: Secondary | ICD-10-CM

## 2020-06-09 NOTE — Progress Notes (Signed)
[facility]  PATIENT: Ana Edwards DOB: Sep 07, 1956  REFERRING CLINICIAN: A Kendall HISTORY FROM: patient  REASON FOR VISIT: follow up   HISTORICAL  CHIEF COMPLAINT:  Chief Complaint  Patient presents with  . Multiple Sclerosis    rm 7, 9 month FU  "no new symptoms, still doing PT and getting a brace for right leg"    HISTORY OF PRESENT ILLNESS:   UPDATE (06/09/20, VRP): Since last visit, doing WELL. Symptoms are stable. Working with PT and planning to get AFO to help gait and stability on right leg. No alleviating or aggravating factors. Tolerating meds. Had covid vaccine (no issues).   UPDATE (09/10/19, VRP): Since last visit, doing well. Symptoms are stable. Severity is mild. No alleviating or aggravating factors. Tolerating copaxone (generic).    UPDATE (03/05/19, VRP): Since last visit, doing about the same. Having some confusion and diff getting started on ocrevus. Symptoms are stable. Severity is mild. No alleviating or aggravating factors.    UPDATE (10/23/18, VRP): Since last visit, doing about the same. Symptoms are stable. Severity is moderate. No alleviating or aggravating factors. MRI scans reviewed. Right > Left leg stiffness continues. Denies any vision changes, slurred speech, hand problems.  PRIOR HPI (07/16/18): 63 year old female here for evaluation of lower extremity stiffness.  Patient reports at least one year of lower extremity stiffness and difficulty walking.  I spoke with patient's daughter via phone who tells me that symptoms have been going on for at least 3 years, progressively worsening.  Patient feels some increased stiffness in her right greater than left leg.  Symptoms are worse when is cold outside.  Symptoms are slightly improved when it is warmer patient has been moving around for a while.  She denies any numbness or tingling.  Patient fell down 3 years ago due to this problem.  She also fell down 2 years ago off of her front steps.     Patient denies any bowel or bladder incontinence.  She denies any problems with her arms or hands.  No slurred speech, vision changes or trouble talking.  Saw orthopedic clinic for evaluation.  X-rays and lab work were tested and apparently unremarkable.   REVIEW OF SYSTEMS: Full 14 system review of systems performed and negative with exception of: MUSCLE CRAMPS WALKING DIFF.  ALLERGIES: Allergies  Allergen Reactions  . Tetracyclines & Related Nausea And Vomiting and Other (See Comments)    sweats    HOME MEDICATIONS: Outpatient Medications Prior to Visit  Medication Sig Dispense Refill  . amLODipine (NORVASC) 10 MG tablet Take 10 mg by mouth daily.    . clopidogrel (PLAVIX) 75 MG tablet TAKE ONE TABLET BY MOUTH ONCE DAILY WITH BREAKFAST 90 tablet 2  . Glatiramer Acetate 40 MG/ML SOSY Inject 45m subcutaneously three (3) times per week 12 mL 5  . metoprolol (LOPRESSOR) 50 MG tablet Take 25 mg by mouth 2 (two) times daily.     .Marland KitchenamLODipine (NORVASC) 5 MG tablet Take 5 mg by mouth daily.   1  . atorvastatin (LIPITOR) 40 MG tablet TAKE ONE TABLET BY MOUTH ONCE DAILY (Patient not taking: Reported on 04/10/2020) 30 tablet 1   No facility-administered medications prior to visit.    PAST MEDICAL HISTORY: Past Medical History:  Diagnosis Date  . Arthritis    "legs sometimes" (02/03/2014)  . CKD (chronic kidney disease) stage 3, GFR 30-59 ml/min 02/04/2014  . Claudication ([place]   . GERD (gastroesophageal reflux disease)   . Hyperlipidemia   .  Hypertension   . Multiple sclerosis ([place]) 10/23/2018   recently dx by dr Leta Baptist   . Neuromuscular disorder ([place])   . PAD (peripheral artery disease) ([place])   . Tobacco abuse    patient says she stopped smoking June 2015    PAST SURGICAL HISTORY: Past Surgical History:  Procedure Laterality Date  . LOWER EXTREMITY ANGIOGRAM N/A 02/03/2014   Procedure: LOWER EXTREMITY ANGIOGRAM;  Surgeon: Lorretta Harp, MD;  Location: [facility] CATH LAB;   Service: Cardiovascular;  Laterality: N/A;  . PERIPHERAL ATHRECTOMY Right 02/03/2014   w/stent SFA    FAMILY HISTORY: Family History  Problem Relation Age of Onset  . Heart disease Mother   . Arthritis Father   . Heart attack Brother     SOCIAL HISTORY: Social History   Socioeconomic History  . Marital status: Single    Spouse name: Not on file  . Number of children: Not on file  . Years of education: Not on file  . Highest education level: Not on file  Occupational History  . Not on file  Tobacco Use  . Smoking status: Former Smoker    Packs/day: 0.12    Years: 18.00    Pack years: 2.16    Types: Cigarettes  . Smokeless tobacco: Never Used  . Tobacco comment: < 1 pack per week  Substance and Sexual Activity  . Alcohol use: No    Comment: quit 5 yrs ago,  2014  . Drug use: Never  . Sexual activity: Not Currently  Other Topics Concern  . Not on file  Social History Narrative   Lives wih son,  Works at [employer]  AD.  Children 4.  Caffeine, drinks iced tea, and occ soda.   Social Determinants of Health   Financial Resource Strain:   . Difficulty of Paying Living Expenses:   Food Insecurity:   . Worried About Charity fundraiser in the Last Year:   . Arboriculturist in the Last Year:   Transportation Needs:   . Film/video editor (Medical):   Marland Kitchen Lack of Transportation (Non-Medical):   Physical Activity:   . Days of Exercise per Week:   . Minutes of Exercise per Session:   Stress:   . Feeling of Stress :   Social Connections:   . Frequency of Communication with Friends and Family:   . Frequency of Social Gatherings with Friends and Family:   . Attends Religious Services:   . Active Member of Clubs or Organizations:   . Attends Archivist Meetings:   Marland Kitchen Marital Status:   Intimate Partner Violence:   . Fear of Current or Ex-Partner:   . Emotionally Abused:   Marland Kitchen Physically Abused:   . Sexually Abused:      PHYSICAL  EXAM  GENERAL EXAM/CONSTITUTIONAL: Vitals:  Vitals:   06/09/20 1042  BP: (!) 170/80  Pulse: (!) 57  Weight: 175 lb (79.4 kg)   Body mass index is 24.75 kg/m. Wt Readings from Last 3 Encounters:  06/09/20 175 lb (79.4 kg)  01/07/20 173 lb (78.5 kg)  09/10/19 173 lb 9.6 oz (78.7 kg)    Patient is in no distress; well developed, nourished and groomed; neck is supple  CARDIOVASCULAR:  Examination of carotid arteries is normal; no carotid bruits  Regular rate and rhythm, no murmurs  Examination of peripheral vascular system by observation and palpation is normal  EYES:  Ophthalmoscopic exam of optic discs and posterior segments is normal; no  papilledema or hemorrhages No exam data present  MUSCULOSKELETAL:  Gait, strength, tone, movements noted in Neurologic exam below  NEUROLOGIC: MENTAL STATUS:  No flowsheet data found.  awake, alert, oriented to person, place and time  recent and remote memory intact  normal attention and concentration  language fluent, comprehension intact, naming intact  fund of knowledge appropriate  CRANIAL NERVE:   2nd - no papilledema on fundoscopic exam  2nd, 3rd, 4th, 6th - pupils equal and reactive to light, visual fields full to confrontation, extraocular muscles intact, no nystagmus  5th - facial sensation symmetric  7th - facial strength symmetric  8th - hearing intact  9th - palate elevates symmetrically, uvula midline  11th - shoulder shrug symmetric  12th - tongue protrusion midline  MOTOR:   BUE 5/5, normal tone  BLE INCREASED TONE; 5 PROX; RIGHT DF 4  SENSORY:   DECR IN RIGHT FOOT  OTHER WISE normal and symmetric to light touch, temperature, vibration  COORDINATION:   finger-nose-finger, fine finger movements normal  REFLEXES:   deep tendon reflexes --> BUE 2; KNEES 2, ANKLES 1  GAIT/STATION:   MODERATE SPASTIC GAIT; UNSTEADY; USES CANE     DIAGNOSTIC DATA (LABS, IMAGING, TESTING) - I  reviewed patient records, labs, notes, testing and imaging myself where available.  Lab Results  Component Value Date   WBC 6.1 10/23/2018   HGB 13.1 10/23/2018   HCT 39.5 10/23/2018   MCV 84 10/23/2018   PLT 238 10/23/2018      Component Value Date/Time   NA 144 10/23/2018 1121   K 4.6 10/23/2018 1121   CL 107 (H) 10/23/2018 1121   CO2 23 10/23/2018 1121   GLUCOSE 108 (H) 10/23/2018 1121   GLUCOSE 90 03/18/2016 1515   BUN 14 10/23/2018 1121   CREATININE 1.26 (H) 10/23/2018 1121   CREATININE 1.26 (H) 03/18/2016 1515   CALCIUM 9.6 10/23/2018 1121   PROT 6.9 10/23/2018 1121   ALBUMIN 4.3 10/23/2018 1121   AST 17 10/23/2018 1121   ALT 10 10/23/2018 1121   ALKPHOS 92 10/23/2018 1121   BILITOT 0.4 10/23/2018 1121   GFRNONAA 46 (L) 10/23/2018 1121   GFRAA 53 (L) 10/23/2018 1121   Lab Results  Component Value Date   CHOL 147 03/18/2016   HDL 41 (L) 03/18/2016   LDLCALC 84 03/18/2016   TRIG 109 03/18/2016   CHOLHDL 3.6 03/18/2016   No results found for: HGBA1C No results found for: VITAMINB12 Lab Results  Component Value Date   TSH 1.83 03/18/2016    05/02/18 Labs (Requested and reviewed from Dr. Delilah Shan office):  ANA - negative CRP 0.9 ESR 11 TSH 1.91 CCP < 16 RF < 14  07/17/18 MRI thoracic spine (with and without) [I reviewed images myself and agree with interpretation. -VRP]  - Mild disc bulging at T2-3, T5-6, T7-8. No spinal stenosis or foraminal narrowing.  - No intrinsic, compressive or abnormal enhancing spinal cord lesions. - Incidental right renal cyst (2.5cm).   07/17/18 MRI lumbar spine (without) [I reviewed images myself and agree with interpretation. -VRP]  - At L4-5: disc bulging and facet hypertrophy and ligamentum flavum hypertrophy with mild spinal stenosis and moderate biforaminal stenosis - At L3-4: disc bulging and facet hypertrophy with mild spinal stenosis and mild biforaminal stenosis - At L2-3: disc bulging and facet hypertrophy with mild  biforaminal stenosis  10/18/18 MRI brain (with and without) [I reviewed images myself and agree with interpretation. -VRP]  - Multiple round and ovoid, confluent,  periventricular, subcortical, cerebellar T2 hyperintensities.  Some of these are hypointense on T1 views. No abnormal lesions are seen on post contrast views. Considerations include autoimmune, inflammatory, demyelinating, post-infectious, or ischemic etiologies.  - See MRI cervical spine results from same day.  The presence of spinal cord lesions raises possibility of demyelinating disease.  10/18/18 MRI cervical (with and without) [I reviewed images myself and agree with interpretation. -VRP]  - The spinal cord is notable for multiple T2 and STIR hyperintense lesions at C2, C3, C5 and T2.  In combination with MRI brain findings from same day, this raises possibility of chronic demyelinating plaques.  Other autoimmune and inflammatory or parainfectious conditions are also possible. - Mild degenerative changes as noted above.    ASSESSMENT AND PLAN  64 y.o. year old female here with progressive muscle stiffness, gait difficulty, lower extremely weakness since 2016; history, exam and MRI consistent with RRMS.    Dx: CNS autoimmune / inflamm (brain, cervical)  1. Multiple sclerosis ([place])   2. Myelopathy ([place])      PLAN:  FACE TO FACE EVALUATION - this visit is eval for right AFO; patient has gait difficulty and needs AFO for stability and gait  MULTIPLE SCLEROSIS (RRMS; brain and spinal cord lesions) - LEG STIFFNESS / WEAKNESS (stable) - on generic copaxone; doing well; may consider ocrevus in future - optimize nutrition, exercise, sleep - stop smoking - optimize vitamin D levels - consider muscle relaxer in future  HYPERTENSION (worsened) - continue BP meds; follow up with PCP  Return in about 9 months (around 03/09/2021).    Penni Bombard, MD 4/88/3014, 15:97 AM Certified in Neurology, Neurophysiology and  Neuroimaging  [facility] [address] [place], [place] [address] [phone]

## 2020-06-11 ENCOUNTER — Ambulatory Visit: Payer: 59

## 2020-06-11 ENCOUNTER — Other Ambulatory Visit: Payer: Self-pay

## 2020-06-11 DIAGNOSIS — R2689 Other abnormalities of gait and mobility: Secondary | ICD-10-CM

## 2020-06-11 DIAGNOSIS — M6281 Muscle weakness (generalized): Secondary | ICD-10-CM

## 2020-06-11 DIAGNOSIS — R293 Abnormal posture: Secondary | ICD-10-CM

## 2020-06-11 DIAGNOSIS — R2681 Unsteadiness on feet: Secondary | ICD-10-CM

## 2020-06-11 NOTE — Therapy (Signed)
[facility] [address] [place], [place], [address] Phone: [phone]   Fax:  [phone]  Physical Therapy Treatment  Patient Details  Name: Ana Edwards MRN: [id number] Date of Birth: 06-25-1956 Referring Provider (PT): Joycelyn Schmid, MD   Encounter Date: 06/11/2020   PT End of Session - 06/11/20 1045    Visit Number 10    Number of Visits 15    Date for PT Re-Evaluation 09/02/20   POC for 6 weeks, Cert for 90 days   Authorization Type [employer]    PT Start Time 1013    PT Stop Time 1058    PT Time Calculation (min) 45 min    Equipment Utilized During Treatment Gait belt    Activity Tolerance Patient tolerated treatment well    Behavior During Therapy [facility] for tasks assessed/performed           Past Medical History:  Diagnosis Date  . Arthritis    "legs sometimes" (02/03/2014)  . CKD (chronic kidney disease) stage 3, GFR 30-59 ml/min 02/04/2014  . Claudication (HCC)   . GERD (gastroesophageal reflux disease)   . Hyperlipidemia   . Hypertension   . Multiple sclerosis (HCC) 10/23/2018   recently dx by dr Marjory Lies   . Neuromuscular disorder (HCC)   . PAD (peripheral artery disease) (HCC)   . Tobacco abuse    patient says she stopped smoking June 2015    Past Surgical History:  Procedure Laterality Date  . LOWER EXTREMITY ANGIOGRAM N/A 02/03/2014   Procedure: LOWER EXTREMITY ANGIOGRAM;  Surgeon: Runell Gess, MD;  Location: [facility] CATH LAB;  Service: Cardiovascular;  Laterality: N/A;  . PERIPHERAL ATHRECTOMY Right 02/03/2014   w/stent SFA    There were no vitals filed for this visit.   Subjective Assessment - 06/11/20 1504    Subjective Patient reports some increased stiffness this morning. Overall no new complaints. [name] from [facility] Present for Session    Pertinent History Arthritis, CKD stage 3, GERD, HTN, Hyperlipidemia, PAD, Multiple Sclerosis    Limitations Standing;Walking    How long  can you walk comfortably? 5-10 mins (more than a block)    Patient Stated Goals Function better and be able to move around better    Currently in Pain? No/denies                             [facility] Adult PT Treatment/Exercise - 06/11/20 0001      Ambulation/Gait   Ambulation/Gait Yes    Ambulation/Gait Assistance 5: Supervision    Ambulation/Gait Assistance Details Supv for safety    Ambulation Distance (Feet) 575 Feet    Assistive device Straight cane    Gait Pattern Decreased step length - left;Decreased stride length;Trendelenburg;Decreased stance time - right;Decreased step length - right;Decreased hip/knee flexion - right;Decreased dorsiflexion - right;Decreased weight shift to right;Poor foot clearance - right    Ambulation Surface Level;Indoor    Stairs Yes    Stairs Assistance 5: Supervision    Stairs Assistance Details (indicate cue type and reason) completed stair training with R AFO donned. PT providing verbal cues for proper ascending/descending pattern.     Stair Management Technique One rail Right;Alternating pattern;Forwards    Number of Stairs 12    Height of Stairs 6    Gait Comments Th[facility] from [facility] was present for today's session to allow for further assessment of gait with R AFOs donned. Patient  ambulated initially with R Thuasne AFO w/ lateral strut x 345 ft. Followed gait training with R Anterior AFO, x 230 ft.  With AFO donned, patient demo improved step length and improved foot clearance. Patient demo no differences between AFO's, however reports that the R Fabio Asa is more comfortable. PT and prosthetist provided education on the importance of compliance with AFO and therefore comfort is important to ensure compliance. Patient would like to go ahead with obtaining R Thuasne AFO with Lateral Strut      Self-Care   Self-Care Other Self-Care Comments    Other Self-Care Comments  PT and prosthetist provided education on proper shoe wear for  AFO, PT provided patient with information to obtain shoes prior to getting AFO. Also educated patient on placing therapy services on hold for 2 weeks to allow for patient to recieve AFO. Educated on continuing current HEP and stationary cycle to allow for BLE ROM and strenghtening.                   PT Education - 06/11/20 1505    Education Details Educated on AFO, Educated on Hold for Therapy to obtain AFO    Person(s) Educated Patient    Methods Explanation    Comprehension Verbalized understanding            PT Short Term Goals - 05/01/20 0937      PT SHORT TERM GOAL #1   Title Patient will be indepdent with initial HEP    Baseline Pt reports independence with initial HEP, going well.    Time 3    Period Weeks    Status Achieved    Target Date 05/01/20      PT SHORT TERM GOAL #2   Title Patient will decrease 5x sit <> stand to <19 seconds w/ UE support to demonstrate improved BLE strength    Baseline 5/14 - 18.43 secs    Time 3    Period Weeks    Status Achieved    Target Date 05/01/20      PT SHORT TERM GOAL #3   Title Further Balance Test (FGA) to be assessed with LTG to be written as appropriate    Baseline FGA assessed on 4/26 - scored 14/30    Time 3    Period Weeks    Status Achieved    Target Date 05/01/20             PT Long Term Goals - 05/29/20 0940      PT LONG TERM GOAL #1   Title Patient will be independent with final HEP    Baseline Patient reports independence, still continue to progress HEP as tolerated    Time 6    Period Weeks    Status On-going      PT LONG TERM GOAL #2   Title Patient will decrease 5x sit <> stand to <15 seconds w/ UE support to demonstrate improved BLE strength and decrease risk for falls    Baseline 21.29 secs, 05/29/20: 18.21 secs    Time 6    Period Weeks    Status On-going      PT LONG TERM GOAL #3   Title Patient will decrease TUG < 14 seconds to decrease risk for falls    Baseline 7.99 secs, 05/29/20:  17.22 secs    Time 6    Period Weeks    Status On-going      PT LONG TERM GOAL #4   Title Patient will  improve FGA from 14/30 to >/= 19/30 to demonstrate improved balance and reduced risk for falls.    Baseline 4/26 - 14/30, 6/11 - 15/30    Time 6    Period Weeks    Status On-going      PT LONG TERM GOAL #5   Title Patient will improve gait speed from 1.48 ft/ second to >2.0 ft/sec to demonstrate improve community mobility    Baseline 1.48 ft/sec, 1.79 ft/sec    Time 6    Period Weeks    Status On-going                 Plan - 06/11/20 1506    Clinical Impression Statement Today's skilled PT session focused on extensive gait training and education regarding AFO. Patient ambulating with R Thuasne AFO with lateral strut, and R anterior AFO. Overall patient demo improved gait pattern with both, but reports improved comfort and compliance R Thuasne AFO. Patient, PT, and Prosthetist in agreement that we will be getting R Thuasne AFO with lateral strut. Therapy will be placed on hold for two weeks to obtain personal AFO. Patient will continue to benefit from skilled PT services to progress toward all unmet goals at this time.    Personal Factors and Comorbidities Comorbidity 3+    Comorbidities Arthritis, CKD stage 3, GERD, HTN, Hyperlipidemia, PAD, Multiple Sclerosis    Examination-Activity Limitations Stand;Squat;Sit    Examination-Participation Restrictions Community Activity;Yard Work    Conservation officer, historic buildings Evolving/Moderate complexity    Rehab Potential Good    PT Frequency 1x / week    PT Duration 6 weeks    PT Treatment/Interventions Gait training;Stair training;Functional mobility training;Therapeutic activities;Therapeutic exercise;Neuromuscular re-education;Patient/family education;Cryotherapy;Moist Heat;DME Instruction;Balance training;Orthotic Fit/Training;Manual techniques;Passive range of motion    PT Next Visit Plan Did patient get AFO? How is it feeling  in new shoes? Continue strenghtening/ROM/balance exercises.    PT Home Exercise Plan Access Code: [email or web address]    Consulted and Agree with Plan of Care Patient           Patient will benefit from skilled therapeutic intervention in order to improve the following deficits and impairments:  Abnormal gait, Decreased balance, Decreased endurance, Decreased activity tolerance, Decreased range of motion, Hypomobility, Difficulty walking, Impaired flexibility, Decreased strength, Decreased mobility, Improper body mechanics, Pain  Visit Diagnosis: Other abnormalities of gait and mobility  Abnormal posture  Unsteadiness on feet  Muscle weakness (generalized)     Problem List Patient Active Problem List   Diagnosis Date Noted  . Hyperlipidemia 03/10/2015  . PAD (peripheral artery disease), 02/03/14 diamondback rotational atherectomy,PTA stent IDEV supera, mid-rt SFA.  Residual tot. L SFA  02/04/2014  . CKD (chronic kidney disease) stage 3, GFR 30-59 ml/min 02/04/2014  . Anemia 02/04/2014  . Claudication, lifestyle limiting 01/24/2014  . Chest pain at rest 01/09/2012    Class: Acute  . Hypertension 01/09/2012    Class: Chronic    Tempie Donning, PT, DPT 06/11/2020, 3:11 PM  [facility] [address] [place], [place], [address] Phone: [phone]   Fax:  [phone]  Name: SHAVAUN OSTERLOH MRN: [id number] Date of Birth: 1956/11/27

## 2020-06-12 ENCOUNTER — Ambulatory Visit: Payer: 59 | Admitting: Cardiovascular Disease

## 2020-06-18 ENCOUNTER — Ambulatory Visit: Payer: 59

## 2020-06-23 ENCOUNTER — Ambulatory Visit: Payer: 59 | Admitting: Cardiovascular Disease

## 2020-06-25 ENCOUNTER — Ambulatory Visit: Payer: 59

## 2020-07-01 ENCOUNTER — Ambulatory Visit: Payer: 59

## 2020-07-09 ENCOUNTER — Ambulatory Visit: Payer: 59 | Attending: Cardiovascular Disease

## 2020-07-09 ENCOUNTER — Other Ambulatory Visit: Payer: Self-pay

## 2020-07-09 DIAGNOSIS — R2681 Unsteadiness on feet: Secondary | ICD-10-CM | POA: Diagnosis present

## 2020-07-09 DIAGNOSIS — R293 Abnormal posture: Secondary | ICD-10-CM | POA: Insufficient documentation

## 2020-07-09 DIAGNOSIS — M6281 Muscle weakness (generalized): Secondary | ICD-10-CM | POA: Diagnosis present

## 2020-07-09 DIAGNOSIS — R2689 Other abnormalities of gait and mobility: Secondary | ICD-10-CM | POA: Insufficient documentation

## 2020-07-09 NOTE — Therapy (Signed)
[facility] [address] [place], [place], [address] Phone: [phone]   Fax:  [phone]  Physical Therapy Treatment  Patient Details  Name: Ana Edwards MRN: [id number] Date of Birth: 30-Mar-1956 Referring Provider (PT): Joycelyn Schmid, MD   Encounter Date: 07/09/2020   PT End of Session - 07/09/20 1023    Visit Number 11    Number of Visits 15    Date for PT Re-Evaluation 09/02/20   POC for 6 weeks, Cert for 90 days   Authorization Type [employer]    PT Start Time 1015    PT Stop Time 1059    PT Time Calculation (min) 44 min    Equipment Utilized During Treatment Gait belt    Activity Tolerance Patient tolerated treatment well    Behavior During Therapy [facility] for tasks assessed/performed           Past Medical History:  Diagnosis Date  . Arthritis    "legs sometimes" (02/03/2014)  . CKD (chronic kidney disease) stage 3, GFR 30-59 ml/min 02/04/2014  . Claudication (HCC)   . GERD (gastroesophageal reflux disease)   . Hyperlipidemia   . Hypertension   . Multiple sclerosis (HCC) 10/23/2018   recently dx by dr Marjory Lies   . Neuromuscular disorder (HCC)   . PAD (peripheral artery disease) (HCC)   . Tobacco abuse    patient says she stopped smoking June 2015    Past Surgical History:  Procedure Laterality Date  . LOWER EXTREMITY ANGIOGRAM N/A 02/03/2014   Procedure: LOWER EXTREMITY ANGIOGRAM;  Surgeon: Runell Gess, MD;  Location: [facility] CATH LAB;  Service: Cardiovascular;  Laterality: N/A;  . PERIPHERAL ATHRECTOMY Right 02/03/2014   w/stent SFA    There were no vitals filed for this visit.   Subjective Assessment - 07/09/20 1018    Subjective Patient reports got new shoes for the AFO, has not recieved the AFO yet. No new issues/complaints, reports she has went back to work 1x/week.    Pertinent History Arthritis, CKD stage 3, GERD, HTN, Hyperlipidemia, PAD, Multiple Sclerosis    Limitations  Standing;Walking    How long can you walk comfortably? 5-10 mins (more than a block)    Patient Stated Goals Function better and be able to move around better    Currently in Pain? No/denies                             [facility] Adult PT Treatment/Exercise - 07/09/20 0001      Ambulation/Gait   Ambulation/Gait Yes    Ambulation/Gait Assistance 5: Supervision    Ambulation/Gait Assistance Details PT providing verbal cues for SPC with quad tip to be placed on left side    Ambulation Distance (Feet) 115 Feet    Assistive device Straight cane    Gait Pattern Decreased step length - left;Decreased stride length;Trendelenburg;Decreased stance time - right;Decreased step length - right;Decreased hip/knee flexion - right;Decreased dorsiflexion - right;Decreased weight shift to right;Poor foot clearance - right    Ambulation Surface Level;Indoor      High Level Balance   High Level Balance Activities Side stepping;Marching forwards;Backward walking    High Level Balance Comments In // bars, completed forward marching with focus on improved hip/knee flexion, backwards walking focused on improved step length, and side stepping. With side stepping verbal cues to keep hips aligned forward. Completed x 3 reps, down and back with BUE support from // bars.  Self-Care   Self-Care Other Self-Care Comments    Other Self-Care Comments  PT called [facility] for patient to determine process for picking up AFO and scheduling appointment as patient was unaware of when needing to pick up AFO. PT providing patient with [facility] information      Exercises   Exercises Other Exercises;Knee/Hip    Other Exercises  PT completed manual stretching to B DF, 2 x 45 secs each, B Hamstrings 2 x 45 seconds, and single knee to chest stretch 2 x 45 seconds, progressing into range as tolerated by patient.       Lumbar Exercises: Stretches   Lower Trunk Rotation 5 reps;20 seconds    Lower Trunk  Rotation Limitations completed 5 reps x 2 sets to bilaterally, with hold of approx 15 - 20 seconds for proper stretch      Knee/Hip Exercises: Standing   Forward Step Up Right;1 set;10 reps;Hand Hold: 2;Step Height: 6";Limitations    Forward Step Up Limitations completed in bars with BUE support    Functional Squat 1 set;15 reps;Limitations    Functional Squat Limitations completed mini squats with BUE support from // bars. PT providing manual facilation for neutral alignment as patient tend to have weight shif tto RLE with completion                    PT Short Term Goals - 07/09/20 1106      PT SHORT TERM GOAL #1   Title STG = LTG's             PT Long Term Goals - 05/29/20 0940      PT LONG TERM GOAL #1   Title Patient will be independent with final HEP    Baseline Patient reports independence, still continue to progress HEP as tolerated    Time 6    Period Weeks    Status On-going      PT LONG TERM GOAL #2   Title Patient will decrease 5x sit <> stand to <15 seconds w/ UE support to demonstrate improved BLE strength and decrease risk for falls    Baseline 21.29 secs, 05/29/20: 18.21 secs    Time 6    Period Weeks    Status On-going      PT LONG TERM GOAL #3   Title Patient will decrease TUG < 14 seconds to decrease risk for falls    Baseline 7.99 secs, 05/29/20: 17.22 secs    Time 6    Period Weeks    Status On-going      PT LONG TERM GOAL #4   Title Patient will improve FGA from 14/30 to >/= 19/30 to demonstrate improved balance and reduced risk for falls.    Baseline 4/26 - 14/30, 6/11 - 15/30    Time 6    Period Weeks    Status On-going      PT LONG TERM GOAL #5   Title Patient will improve gait speed from 1.48 ft/ second to >2.0 ft/sec to demonstrate improve community mobility    Baseline 1.48 ft/sec, 1.79 ft/sec    Time 6    Period Weeks    Status On-going                 Plan - 07/09/20 1103    Clinical Impression Statement Patient  hsa not recieved AFO from [facility] at this time, PT calling [facility] and helping patient set up process for obtaining AFO. Continued session focused on  BLE stretching, strengthening, and balance activites with UE support. Patient will continue to beneift from skilled PT services to progress toward all goals.    Personal Factors and Comorbidities Comorbidity 3+    Comorbidities Arthritis, CKD stage 3, GERD, HTN, Hyperlipidemia, PAD, Multiple Sclerosis    Examination-Activity Limitations Stand;Squat;Sit    Examination-Participation Restrictions Community Activity;Yard Work    Conservation officer, historic buildings Evolving/Moderate complexity    Rehab Potential Good    PT Frequency 1x / week    PT Duration 6 weeks    PT Treatment/Interventions Gait training;Stair training;Functional mobility training;Therapeutic activities;Therapeutic exercise;Neuromuscular re-education;Patient/family education;Cryotherapy;Moist Heat;DME Instruction;Balance training;Orthotic Fit/Training;Manual techniques;Passive range of motion    PT Next Visit Plan Did we get AFO? Continue strenghtening/ROM/balance exercises.    PT Home Exercise Plan Access Code: [email or web address]    Consulted and Agree with Plan of Care Patient           Patient will benefit from skilled therapeutic intervention in order to improve the following deficits and impairments:  Abnormal gait, Decreased balance, Decreased endurance, Decreased activity tolerance, Decreased range of motion, Hypomobility, Difficulty walking, Impaired flexibility, Decreased strength, Decreased mobility, Improper body mechanics, Pain  Visit Diagnosis: Other abnormalities of gait and mobility  Abnormal posture  Unsteadiness on feet  Muscle weakness (generalized)     Problem List Patient Active Problem List   Diagnosis Date Noted  . Hyperlipidemia 03/10/2015  . PAD (peripheral artery disease), 02/03/14 diamondback rotational atherectomy,PTA stent IDEV supera, mid-rt SFA.   Residual tot. L SFA  02/04/2014  . CKD (chronic kidney disease) stage 3, GFR 30-59 ml/min 02/04/2014  . Anemia 02/04/2014  . Claudication, lifestyle limiting 01/24/2014  . Chest pain at rest 01/09/2012    Class: Acute  . Hypertension 01/09/2012    Class: Chronic    Tempie Donning, PT, DPT 07/09/2020, 11:08 AM  [facility] [address] [place], [place], [address] Phone: [phone]   Fax:  [phone]  Name: Ana Edwards MRN: [id number] Date of Birth: 04/19/1956

## 2020-07-16 ENCOUNTER — Other Ambulatory Visit: Payer: Self-pay

## 2020-07-16 ENCOUNTER — Ambulatory Visit: Payer: 59

## 2020-07-16 DIAGNOSIS — R2681 Unsteadiness on feet: Secondary | ICD-10-CM

## 2020-07-16 DIAGNOSIS — R293 Abnormal posture: Secondary | ICD-10-CM

## 2020-07-16 DIAGNOSIS — R2689 Other abnormalities of gait and mobility: Secondary | ICD-10-CM | POA: Diagnosis not present

## 2020-07-16 DIAGNOSIS — M6281 Muscle weakness (generalized): Secondary | ICD-10-CM

## 2020-07-16 NOTE — Therapy (Signed)
[facility] [address] [place], [place], [address] Phone: [phone]   Fax:  [phone]  Physical Therapy Treatment  Patient Details  Name: Ana Edwards MRN: [id number] Date of Birth: [birth year] Referring Provider (PT): Joycelyn Schmid, MD   Encounter Date: 07/16/2020   PT End of Session - 07/16/20 1020    Visit Number 12    Number of Visits 15    Date for PT Re-Evaluation 09/02/20   POC for 6 weeks, Cert for 90 days   Authorization Type [employer]    PT Start Time 1015    PT Stop Time 1100    PT Time Calculation (min) 45 min    Equipment Utilized During Treatment Gait belt    Activity Tolerance Patient tolerated treatment well    Behavior During Therapy [facility] for tasks assessed/performed           Past Medical History:  Diagnosis Date  . Arthritis    "legs sometimes" (02/03/2014)  . CKD (chronic kidney disease) stage 3, GFR 30-59 ml/min 02/04/2014  . Claudication (HCC)   . GERD (gastroesophageal reflux disease)   . Hyperlipidemia   . Hypertension   . Multiple sclerosis (HCC) 10/23/2018   recently dx by dr Marjory Lies   . Neuromuscular disorder (HCC)   . PAD (peripheral artery disease) (HCC)   . Tobacco abuse    patient says she stopped smoking June 2015    Past Surgical History:  Procedure Laterality Date  . LOWER EXTREMITY ANGIOGRAM N/A 02/03/2014   Procedure: LOWER EXTREMITY ANGIOGRAM;  Surgeon: Runell Gess, MD;  Location: [facility] CATH LAB;  Service: Cardiovascular;  Laterality: N/A;  . PERIPHERAL ATHRECTOMY Right 02/03/2014   w/stent SFA    There were no vitals filed for this visit.   Subjective Assessment - 07/16/20 1019    Subjective Patient reports doing good since last visit. Reports more stiffness this morning, has not been doing as much due to the heat.    Pertinent History Arthritis, CKD stage 3, GERD, HTN, Hyperlipidemia, PAD, Multiple Sclerosis    Limitations Standing;Walking     How long can you walk comfortably? 5-10 mins (more than a block)    Patient Stated Goals Function better and be able to move around better    Currently in Pain? No/denies                             [facility] Adult PT Treatment/Exercise - 07/16/20 0001      Transfers   Transfers Sit to Stand;Stand to Sit    Sit to Stand 6: Modified independent (Device/Increase time)    Stand to Sit 6: Modified independent (Device/Increase time)      Ambulation/Gait   Ambulation/Gait Yes    Ambulation/Gait Assistance 5: Supervision    Ambulation/Gait Assistance Details Thayer Ohm from [place] present for session, bringing patient's personal AFO. PT and Orthotist working with fitting AFO to new shoe. Completed gait training with x 230 ft with AFO.     Ambulation Distance (Feet) 230 Feet    Assistive device Straight cane    Gait Pattern Decreased step length - left;Decreased stride length;Trendelenburg;Decreased stance time - right;Decreased step length - right;Decreased hip/knee flexion - right;Decreased dorsiflexion - right;Decreased weight shift to right;Poor foot clearance - right    Ambulation Surface Level;Indoor    Stairs Yes    Stairs Assistance 5: Supervision    Stairs Assistance Details (indicate cue type and  reason) completed stairs with R AFO Donned. PT providing verbal cues for technique    Stair Management Technique One rail Right;Alternating pattern;Forwards    Number of Stairs 8    Height of Stairs 6      Self-Care   Self-Care Other Self-Care Comments    Other Self-Care Comments  PT educating on wear schedule for new AFO. Also educating on proper don/doffing of AFO and placement in the shoes.       Exercises   Exercises Other Exercises    Other Exercises  PT completed manual stretching including single to knee to chest stretch, 3 x 45 seconds, progressing as tolerated by patient.       Knee/Hip Exercises: Aerobic   Nustep Completed NuStep x 6 mins on Level 3 with BLE/BUE for  improved BLE ROM. Completed with R AFO Donned.       Knee/Hip Exercises: Standing   Hip Extension AROM;Stengthening;Both;1 set;15 reps;Knee straight;Limitations    Extension Limitations verbal cues for improved knee extension with completion.     Other Standing Knee Exercises Completed mini squats, 1 x 10 reps with focus on keeping knees tracking properly throughout completion and upright posture.                     PT Short Term Goals - 07/09/20 1106      PT SHORT TERM GOAL #1   Title STG = LTG's             PT Long Term Goals - 05/29/20 0940      PT LONG TERM GOAL #1   Title Patient will be independent with final HEP    Baseline Patient reports independence, still continue to progress HEP as tolerated    Time 6    Period Weeks    Status On-going      PT LONG TERM GOAL #2   Title Patient will decrease 5x sit <> stand to <15 seconds w/ UE support to demonstrate improved BLE strength and decrease risk for falls    Baseline 21.29 secs, 05/29/20: 18.21 secs    Time 6    Period Weeks    Status On-going      PT LONG TERM GOAL #3   Title Patient will decrease TUG < 14 seconds to decrease risk for falls    Baseline 7.99 secs, 05/29/20: 17.22 secs    Time 6    Period Weeks    Status On-going      PT LONG TERM GOAL #4   Title Patient will improve FGA from 14/30 to >/= 19/30 to demonstrate improved balance and reduced risk for falls.    Baseline 4/26 - 14/30, 6/11 - 15/30    Time 6    Period Weeks    Status On-going      PT LONG TERM GOAL #5   Title Patient will improve gait speed from 1.48 ft/ second to >2.0 ft/sec to demonstrate improve community mobility    Baseline 1.48 ft/sec, 1.79 ft/sec    Time 6    Period Weeks    Status On-going                 Plan - 07/16/20 1232    Clinical Impression Statement Patient recieved AFO during session today as Orthostist present, with patient completing ambulation with R AFO donned. Patient continue to demo  improved stability and gait pattern with AFO donned. PT educating on wear schedule as well during today's session.  Personal Factors and Comorbidities Comorbidity 3+    Comorbidities Arthritis, CKD stage 3, GERD, HTN, Hyperlipidemia, PAD, Multiple Sclerosis    Examination-Activity Limitations Stand;Squat;Sit    Examination-Participation Restrictions Community Activity;Yard Work    Conservation officer, historic buildings Evolving/Moderate complexity    Rehab Potential Good    PT Frequency 1x / week    PT Duration 6 weeks    PT Treatment/Interventions Gait training;Stair training;Functional mobility training;Therapeutic activities;Therapeutic exercise;Neuromuscular re-education;Patient/family education;Cryotherapy;Moist Heat;DME Instruction;Balance training;Orthotic Fit/Training;Manual techniques;Passive range of motion    PT Next Visit Plan Continue strenghtening/ROM/balance exercises.    PT Home Exercise Plan Access Code: [email or web address]    Consulted and Agree with Plan of Care Patient           Patient will benefit from skilled therapeutic intervention in order to improve the following deficits and impairments:  Abnormal gait, Decreased balance, Decreased endurance, Decreased activity tolerance, Decreased range of motion, Hypomobility, Difficulty walking, Impaired flexibility, Decreased strength, Decreased mobility, Improper body mechanics, Pain  Visit Diagnosis: Other abnormalities of gait and mobility  Abnormal posture  Unsteadiness on feet  Muscle weakness (generalized)     Problem List Patient Active Problem List   Diagnosis Date Noted  . Hyperlipidemia 03/10/2015  . PAD (peripheral artery disease), 02/03/14 diamondback rotational atherectomy,PTA stent IDEV supera, mid-rt SFA.  Residual tot. L SFA  02/04/2014  . CKD (chronic kidney disease) stage 3, GFR 30-59 ml/min 02/04/2014  . Anemia 02/04/2014  . Claudication, lifestyle limiting 01/24/2014  . Chest pain at rest 01/09/2012     Class: Acute  . Hypertension 01/09/2012    Class: Chronic    Tempie Donning, PT, DPT 07/16/2020, 12:34 PM  [facility] [address] [place], [place], [address] Phone: [phone]   Fax:  [phone]  Name: Ana Edwards MRN: [id number] Date of Birth: 28-Feb-1956

## 2020-07-21 ENCOUNTER — Telehealth: Payer: Self-pay | Admitting: Diagnostic Neuroimaging

## 2020-07-21 ENCOUNTER — Encounter: Payer: Self-pay | Admitting: *Deleted

## 2020-07-21 NOTE — Telephone Encounter (Signed)
Letter placed at front desk for pick up

## 2020-07-21 NOTE — Telephone Encounter (Signed)
Pt is asking for a note from Dr Marjory Lies stating that for work she needs a stool to sit on because of her MS.  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  Pt states it needs to state for how long the stool is needed, pt asking it states indefinite.

## 2020-07-21 NOTE — Telephone Encounter (Signed)
Agree  for accommodation. -VRP

## 2020-07-21 NOTE — Telephone Encounter (Signed)
Accomodation letter on MD desk for review, signature.

## 2020-07-21 NOTE — Telephone Encounter (Signed)
Letter signed, called patient and informed her. She hasn't gotten fax # yet, stated if she does she'll send it via my chart. If not, either she or her son,  Loleta Rose will pick it up in the morning. Patient verbalized understanding, appreciation.

## 2020-07-22 ENCOUNTER — Ambulatory Visit: Payer: 59 | Admitting: Cardiovascular Disease

## 2020-07-23 ENCOUNTER — Ambulatory Visit: Payer: 59 | Attending: Cardiovascular Disease

## 2020-07-23 ENCOUNTER — Other Ambulatory Visit: Payer: Self-pay

## 2020-07-23 DIAGNOSIS — R2681 Unsteadiness on feet: Secondary | ICD-10-CM | POA: Insufficient documentation

## 2020-07-23 DIAGNOSIS — R293 Abnormal posture: Secondary | ICD-10-CM | POA: Diagnosis present

## 2020-07-23 DIAGNOSIS — M6281 Muscle weakness (generalized): Secondary | ICD-10-CM | POA: Insufficient documentation

## 2020-07-23 DIAGNOSIS — R2689 Other abnormalities of gait and mobility: Secondary | ICD-10-CM | POA: Insufficient documentation

## 2020-07-23 NOTE — Therapy (Signed)
[facility] [address] [place], [place], [address] Phone: [phone]   Fax:  [phone]  Physical Therapy Treatment  Patient Details  Name: Ana Edwards MRN: [id number] Date of Birth: [birth year] Referring Provider (PT): Joycelyn Schmid, MD   Encounter Date: 07/23/2020   PT End of Session - 07/23/20 1013    Visit Number 13    Number of Visits 15    Date for PT Re-Evaluation 09/02/20   POC for 6 weeks, Cert for 90 days   Authorization Type [employer]    PT Start Time 1013    PT Stop Time 1057    PT Time Calculation (min) 44 min    Equipment Utilized During Treatment Gait belt    Activity Tolerance Patient tolerated treatment well    Behavior During Therapy [facility] for tasks assessed/performed           Past Medical History:  Diagnosis Date  . Arthritis    "legs sometimes" (02/03/2014)  . CKD (chronic kidney disease) stage 3, GFR 30-59 ml/min 02/04/2014  . Claudication (HCC)   . GERD (gastroesophageal reflux disease)   . Hyperlipidemia   . Hypertension   . Multiple sclerosis (HCC) 10/23/2018   recently dx by dr Marjory Lies   . Neuromuscular disorder (HCC)   . PAD (peripheral artery disease) (HCC)   . Tobacco abuse    patient says she stopped smoking June 2015    Past Surgical History:  Procedure Laterality Date  . LOWER EXTREMITY ANGIOGRAM N/A 02/03/2014   Procedure: LOWER EXTREMITY ANGIOGRAM;  Surgeon: Runell Gess, MD;  Location: [facility] CATH LAB;  Service: Cardiovascular;  Laterality: N/A;  . PERIPHERAL ATHRECTOMY Right 02/03/2014   w/stent SFA    There were no vitals filed for this visit.   Subjective Assessment - 07/23/20 1016    Subjective Patients reports no new issues. Wearing brace is going well. Have wore about 4 hours in a row so far with no issues.    Pertinent History Arthritis, CKD stage 3, GERD, HTN, Hyperlipidemia, PAD, Multiple Sclerosis    Limitations Standing;Walking    How long can  you walk comfortably? 5-10 mins (more than a block)    Patient Stated Goals Function better and be able to move around better    Currently in Pain? No/denies                             [facility] Adult PT Treatment/Exercise - 07/23/20 0001      Transfers   Transfers Sit to Stand;Stand to Sit    Sit to Stand 6: Modified independent (Device/Increase time)    Stand to Sit 6: Modified independent (Device/Increase time)      Ambulation/Gait   Ambulation/Gait Yes    Ambulation/Gait Assistance 5: Supervision    Ambulation/Gait Assistance Details throughout therapy gym with session    Assistive device Straight cane    Gait Pattern Decreased step length - left;Decreased stride length;Trendelenburg;Decreased stance time - right;Decreased step length - right;Decreased hip/knee flexion - right;Decreased dorsiflexion - right;Decreased weight shift to right;Poor foot clearance - right    Ambulation Surface Level;Indoor    Gait Comments PT providing verbal cues for placing SPC on the L side to help support the RLE and for improved gait pattern.       Neuro Re-ed    Neuro Re-ed Details  Standing without UE support completed 1 x 15 reps of alternating toe tpas  to 4" step. PT providing Min Guard to maintain balance with toe tap on LLE due to RLE weakness and imbalance.       Exercises   Exercises Other Exercises    Other Exercises  Reviewed HEP and updated/progressed as tolerated by patient.            Access Code: [email or web address] URL: [email or web address] Date: 07/23/2020 Prepared by: Jethro Bastos  Exercises Sitting Heel Slide with Towel - 1 x daily - 7 x weekly - 10 reps - 3 sets Sit to Stand with Armchair - 1 x daily - 7 x weekly - 2 sets - 10 reps Seated Hamstring Stretch - 1 x daily - 7 x weekly - 1 sets - 3 reps - 30 hold Modified Thomas Stretch - 1 x daily - 7 x weekly - 1 sets - 3 reps - 30 hold Standing Gastroc Stretch at Counter - 1 x daily - 7 x weekly - 1  sets - 3 reps - 30 hold Seated Ankle Eversion AROM - 1 x daily - 7 x weekly - 3 sets - 10 reps Mini Squat with Counter Support - 1 x daily - 7 x weekly - 3 sets - 10 reps Standing March with Counter Support - 1 x daily - 7 x weekly - 3 sets - 10 reps Romberg Stance on Foam Pad with Head Rotation - 1 x daily - 7 x weekly - 2 sets - 10 reps Romberg Stance Eyes Closed on Foam Pad - 1 x daily - 7 x weekly - 1 sets - 3 reps - 15-30 hold Supine Bridge with Resistance Band - 1 x daily - 7 x weekly - 2 sets - 10 reps Clamshell with Resistance - 1 x daily - 7 x weekly - 2 sets - 10 reps Supine Hip Adduction Isometric with Ball - 1 x daily - 7 x weekly - 2 sets - 10 reps Seated Toe Raise - 1 x daily - 7 x weekly - 2 sets - 10 reps  Clamshell and Supine Bridge with Resistance completed with Red Theraband today.      Balance Exercises - 07/23/20 0001      Balance Exercises: Standing   Standing Eyes Opened Wide (BOA);Foam/compliant surface;Head turns;Limitations    Standing Eyes Opened Limitations completed 2 x 10 reps of horizontal/vertical head turns    Standing Eyes Closed Wide (BOA);Foam/compliant surface;3 reps;30 secs;Limitations    Standing Eyes Closed Limitations intermittent CGA required from PT    Tandem Stance Eyes open;Upper extremity support 1;3 reps;30 secs;Limitations    Tandem Stance Time utilizing countertop for support for 1 UE             PT Education - 07/23/20 1043    Education Details Educated on HEP Update    Person(s) Educated Patient    Methods Explanation;Demonstration;Handout    Comprehension Verbalized understanding;Returned demonstration             PT Short Term Goals - 07/09/20 1106      PT SHORT TERM GOAL #1   Title STG = LTG's             PT Long Term Goals - 05/29/20 0940      PT LONG TERM GOAL #1   Title Patient will be independent with final HEP    Baseline Patient reports independence, still continue to progress HEP as tolerated    Time  6    Period Weeks    Status On-going  PT LONG TERM GOAL #2   Title Patient will decrease 5x sit <> stand to <15 seconds w/ UE support to demonstrate improved BLE strength and decrease risk for falls    Baseline 21.29 secs, 05/29/20: 18.21 secs    Time 6    Period Weeks    Status On-going      PT LONG TERM GOAL #3   Title Patient will decrease TUG < 14 seconds to decrease risk for falls    Baseline 7.99 secs, 05/29/20: 17.22 secs    Time 6    Period Weeks    Status On-going      PT LONG TERM GOAL #4   Title Patient will improve FGA from 14/30 to >/= 19/30 to demonstrate improved balance and reduced risk for falls.    Baseline 4/26 - 14/30, 6/11 - 15/30    Time 6    Period Weeks    Status On-going      PT LONG TERM GOAL #5   Title Patient will improve gait speed from 1.48 ft/ second to >2.0 ft/sec to demonstrate improve community mobility    Baseline 1.48 ft/sec, 1.79 ft/sec    Time 6    Period Weeks    Status On-going                 Plan - 07/23/20 1059    Clinical Impression Statement Reviewed and updated/progressed HEP as tolerated by patient. Able to progress to red theraband with strengthening exercises today. Continued balance activites including on ocmplaint surfaces, increased dificulty with vision removed requiring CGA from PT.    Personal Factors and Comorbidities Comorbidity 3+    Comorbidities Arthritis, CKD stage 3, GERD, HTN, Hyperlipidemia, PAD, Multiple Sclerosis    Examination-Activity Limitations Stand;Squat;Sit    Examination-Participation Restrictions Community Activity;Yard Work    Conservation officer, historic buildings Evolving/Moderate complexity    Rehab Potential Good    PT Frequency 1x / week    PT Duration 6 weeks    PT Treatment/Interventions Gait training;Stair training;Functional mobility training;Therapeutic activities;Therapeutic exercise;Neuromuscular re-education;Patient/family education;Cryotherapy;Moist Heat;DME Instruction;Balance  training;Orthotic Fit/Training;Manual techniques;Passive range of motion    PT Next Visit Plan Continue strenghtening/ROM/balance exercises.    PT Home Exercise Plan Access Code: [email or web address]    Consulted and Agree with Plan of Care Patient           Patient will benefit from skilled therapeutic intervention in order to improve the following deficits and impairments:  Abnormal gait, Decreased balance, Decreased endurance, Decreased activity tolerance, Decreased range of motion, Hypomobility, Difficulty walking, Impaired flexibility, Decreased strength, Decreased mobility, Improper body mechanics, Pain  Visit Diagnosis: Other abnormalities of gait and mobility  Abnormal posture  Unsteadiness on feet  Muscle weakness (generalized)     Problem List Patient Active Problem List   Diagnosis Date Noted  . Hyperlipidemia 03/10/2015  . PAD (peripheral artery disease), 02/03/14 diamondback rotational atherectomy,PTA stent IDEV supera, mid-rt SFA.  Residual tot. L SFA  02/04/2014  . CKD (chronic kidney disease) stage 3, GFR 30-59 ml/min 02/04/2014  . Anemia 02/04/2014  . Claudication, lifestyle limiting 01/24/2014  . Chest pain at rest 01/09/2012    Class: Acute  . Hypertension 01/09/2012    Class: Chronic    Tempie Donning, PT, DPT 07/23/2020, 11:00 AM  [facility] [address] [place], [place], [address] Phone: [phone]   Fax:  [phone]  Name: Ana Edwards MRN: [id number] Date of Birth: 12/06/1956

## 2020-07-23 NOTE — Patient Instructions (Signed)
Access Code: [email or web address] URL: [email or web address] Date: 07/23/2020 Prepared by: Jethro Bastos  Exercises Sitting Heel Slide with Towel - 1 x daily - 7 x weekly - 10 reps - 3 sets Sit to Stand with Armchair - 1 x daily - 7 x weekly - 2 sets - 10 reps Seated Hamstring Stretch - 1 x daily - 7 x weekly - 1 sets - 3 reps - 30 hold Modified Thomas Stretch - 1 x daily - 7 x weekly - 1 sets - 3 reps - 30 hold Standing Gastroc Stretch at Counter - 1 x daily - 7 x weekly - 1 sets - 3 reps - 30 hold Seated Ankle Eversion AROM - 1 x daily - 7 x weekly - 3 sets - 10 reps Mini Squat with Counter Support - 1 x daily - 7 x weekly - 3 sets - 10 reps Standing March with Counter Support - 1 x daily - 7 x weekly - 3 sets - 10 reps Romberg Stance on Foam Pad with Head Rotation - 1 x daily - 7 x weekly - 2 sets - 10 reps Romberg Stance Eyes Closed on Foam Pad - 1 x daily - 7 x weekly - 1 sets - 3 reps - 15-30 hold Supine Bridge with Resistance Band - 1 x daily - 7 x weekly - 2 sets - 10 reps Clamshell with Resistance - 1 x daily - 7 x weekly - 2 sets - 10 reps Supine Hip Adduction Isometric with Ball - 1 x daily - 7 x weekly - 2 sets - 10 reps Seated Toe Raise - 1 x daily - 7 x weekly - 2 sets - 10 reps

## 2020-07-28 ENCOUNTER — Other Ambulatory Visit: Payer: Self-pay

## 2020-07-28 ENCOUNTER — Ambulatory Visit: Payer: 59

## 2020-07-28 DIAGNOSIS — M6281 Muscle weakness (generalized): Secondary | ICD-10-CM

## 2020-07-28 DIAGNOSIS — R293 Abnormal posture: Secondary | ICD-10-CM

## 2020-07-28 DIAGNOSIS — R2689 Other abnormalities of gait and mobility: Secondary | ICD-10-CM | POA: Diagnosis not present

## 2020-07-28 DIAGNOSIS — R2681 Unsteadiness on feet: Secondary | ICD-10-CM

## 2020-07-28 NOTE — Therapy (Signed)
[facility] [address] [place], [place], [address] Phone: [phone]   Fax:  [phone]  Physical Therapy Treatment  Patient Details  Name: Ana Edwards MRN: [id number] Date of Birth: Jul 27, 1956 Referring Provider (PT): Andrey Spearman, MD   Encounter Date: 07/28/2020   PT End of Session - 07/28/20 1105    Visit Number 14    Number of Visits 15    Date for PT Re-Evaluation 09/02/20   POC for 6 weeks, Cert for 90 days   Authorization Type [employer]    PT Start Time 1100    PT Stop Time 1145    PT Time Calculation (min) 45 min    Equipment Utilized During Treatment Gait belt    Activity Tolerance Patient tolerated treatment well    Behavior During Therapy [facility] for tasks assessed/performed           Past Medical History:  Diagnosis Date  . Arthritis    "legs sometimes" (02/03/2014)  . CKD (chronic kidney disease) stage 3, GFR 30-59 ml/min 02/04/2014  . Claudication ([place])   . GERD (gastroesophageal reflux disease)   . Hyperlipidemia   . Hypertension   . Multiple sclerosis ([place]) 10/23/2018   recently dx by dr Leta Baptist   . Neuromuscular disorder ([place])   . PAD (peripheral artery disease) ([place])   . Tobacco abuse    patient says she stopped smoking June 2015    Past Surgical History:  Procedure Laterality Date  . LOWER EXTREMITY ANGIOGRAM N/A 02/03/2014   Procedure: LOWER EXTREMITY ANGIOGRAM;  Surgeon: Lorretta Harp, MD;  Location: [facility] CATH LAB;  Service: Cardiovascular;  Laterality: N/A;  . PERIPHERAL ATHRECTOMY Right 02/03/2014   w/stent SFA    There were no vitals filed for this visit.   Subjective Assessment - 07/28/20 1103    Subjective Patient reports no new complaints/no falls. Reports brace is still going well.    Pertinent History Arthritis, CKD stage 3, GERD, HTN, Hyperlipidemia, PAD, Multiple Sclerosis    Limitations Standing;Walking    How long can you walk comfortably? 5-10 mins  (more than a block)    Patient Stated Goals Function better and be able to move around better    Currently in Pain? No/denies                             [facility] Adult PT Treatment/Exercise - 07/28/20 0001      Transfers   Transfers Sit to Stand;Stand to Sit    Sit to Stand 6: Modified independent (Device/Increase time)    Stand to Sit 6: Modified independent (Device/Increase time)      Ambulation/Gait   Ambulation/Gait Yes    Ambulation/Gait Assistance 5: Supervision    Ambulation/Gait Assistance Details completed ambulation with patient's personal R AFO donned and SPC with quad tip attachment. Patient continues to demo improved gait speed and pattern with AFO donned.     Ambulation Distance (Feet) 200 Feet    Assistive device Straight cane    Gait Pattern Decreased step length - left;Decreased stride length;Trendelenburg;Decreased stance time - right;Decreased step length - right;Decreased hip/knee flexion - right;Decreased dorsiflexion - right;Decreased weight shift to right;Poor foot clearance - right    Ambulation Surface Level;Indoor    Gait velocity 14.38 secs = 2.28 ft/sec      Timed Up and Go Test   TUG Normal TUG    Normal TUG (seconds) 15.75  Self-Care   Self-Care Other Self-Care Comments    Other Self-Care Comments  PT continuing to educate on gradual progression of wear time, as well as tips/strategies to try for improved driving abilities with AFO donned, including unstrapping the anterior strap or utilizing hip/knee flexion. Patient demo ability to donning/doffing AFO with no assistance from PT.       Exercises   Exercises Knee/Hip      Knee/Hip Exercises: Seated   Marching Strengthening;Both;10 reps;2 sets;Limitations    Marching Limitations completed with yellow theraband around thighs, verbal cues for posture with completion    Hamstring Curl Both;2 sets;10 reps;Limitations    Hamstring Limitations compelted with yellow theraband, PT  providing verbal cues for imporved knee flexion on RLE.     Abduction/Adduction  Both;2 sets;10 reps;Limitations    Abd/Adduction Limitations completed resisted hip abduction in 2 x 10 reps, completed with yellow theraband,                     PT Short Term Goals - 07/09/20 1106      PT SHORT TERM GOAL #1   Title STG = LTG's             PT Long Term Goals - 07/28/20 1118      PT LONG TERM GOAL #1   Title Patient will be independent with final HEP    Baseline Patient reports independence, still continue to progress HEP as tolerated    Time 6    Period Weeks    Status On-going      PT LONG TERM GOAL #2   Title Patient will decrease 5x sit <> stand to <15 seconds w/ UE support to demonstrate improved BLE strength and decrease risk for falls    Baseline 21.29 secs, 05/29/20: 18.21 secs    Time 6    Period Weeks    Status On-going      PT LONG TERM GOAL #3   Title Patient will decrease TUG < 14 seconds to decrease risk for falls    Baseline 17.99 secs, 05/29/20: 17.22 secs, 15.75 secs on 8/10    Time 6    Period Weeks    Status Not Met      PT LONG TERM GOAL #4   Title Patient will improve FGA from 14/30 to >/= 19/30 to demonstrate improved balance and reduced risk for falls.    Baseline 4/26 - 14/30, 6/11 - 15/30    Time 6    Period Weeks    Status On-going      PT LONG TERM GOAL #5   Title Patient will improve gait speed from 1.48 ft/ second to >2.0 ft/sec to demonstrate improve community mobility    Baseline 1.48 ft/sec, 1.79 ft/sec, 2.28 ft/sec on 8/10    Time 6    Period Weeks    Status Achieved                 Plan - 07/28/20 1154    Clinical Impression Statement PT educating on wear time for AFO as well as ensuring patient knows how to properly donn/doff orthotic. Began assessment of patient's progress toward LTG's, patient able to meet 1 out of 2 LTG's assessed during today's session. Demonstrating improved gait speed, ambulating at 2.28  ft/sec. Patient has demonstrated signfiicant progress with PT services, and demo readiness for discharge at end of POC.    Personal Factors and Comorbidities Comorbidity 3+    Comorbidities Arthritis, CKD stage 3, GERD,  HTN, Hyperlipidemia, PAD, Multiple Sclerosis    Examination-Activity Limitations Stand;Squat;Sit    Examination-Participation Restrictions Community Activity;Yard Work    Merchant navy officer Evolving/Moderate complexity    Rehab Potential Good    PT Frequency 1x / week    PT Duration 6 weeks    PT Treatment/Interventions Gait training;Stair training;Functional mobility training;Therapeutic activities;Therapeutic exercise;Neuromuscular re-education;Patient/family education;Cryotherapy;Moist Heat;DME Instruction;Balance training;Orthotic Fit/Training;Manual techniques;Passive range of motion    PT Next Visit Plan Finishing Checking LTG/Discharge    PT Home Exercise Plan Access Code: [email or web address]    Consulted and Agree with Plan of Care Patient           Patient will benefit from skilled therapeutic intervention in order to improve the following deficits and impairments:  Abnormal gait, Decreased balance, Decreased endurance, Decreased activity tolerance, Decreased range of motion, Hypomobility, Difficulty walking, Impaired flexibility, Decreased strength, Decreased mobility, Improper body mechanics, Pain  Visit Diagnosis: Other abnormalities of gait and mobility  Abnormal posture  Unsteadiness on feet  Muscle weakness (generalized)     Problem List Patient Active Problem List   Diagnosis Date Noted  . Hyperlipidemia 03/10/2015  . PAD (peripheral artery disease), 02/03/14 diamondback rotational atherectomy,PTA stent IDEV supera, mid-rt SFA.  Residual tot. L SFA  02/04/2014  . CKD (chronic kidney disease) stage 3, GFR 30-59 ml/min 02/04/2014  . Anemia 02/04/2014  . Claudication, lifestyle limiting 01/24/2014  . Chest pain at rest 01/09/2012    Class:  Acute  . Hypertension 01/09/2012    Class: Chronic    Jones Bales, PT, DPT 07/28/2020, 12:00 PM  [facility] [address] [place], [place], [address] Phone: [phone]   Fax:  [phone]  Name: DLYNN RANES MRN: [id number] Date of Birth: 04/29/1956

## 2020-08-06 ENCOUNTER — Other Ambulatory Visit: Payer: Self-pay

## 2020-08-06 ENCOUNTER — Ambulatory Visit: Payer: 59

## 2020-08-06 DIAGNOSIS — R2681 Unsteadiness on feet: Secondary | ICD-10-CM

## 2020-08-06 DIAGNOSIS — M6281 Muscle weakness (generalized): Secondary | ICD-10-CM

## 2020-08-06 DIAGNOSIS — R293 Abnormal posture: Secondary | ICD-10-CM

## 2020-08-06 DIAGNOSIS — R2689 Other abnormalities of gait and mobility: Secondary | ICD-10-CM | POA: Diagnosis not present

## 2020-08-06 NOTE — Therapy (Signed)
[facility] [address] [place], [place], [address] Phone: [phone]   Fax:  [phone]  Physical Therapy Treatment/Discharge Summary  Patient Details  Name: Ana Edwards MRN: [id number] Date of Birth: 1956-10-18 Referring Provider (PT): Andrey Spearman, MD  PHYSICAL THERAPY DISCHARGE SUMMARY  Visits from Start of Care: 15  Current functional level related to goals / functional outcomes: See Clinical Impression Statement for Details.   Remaining deficits: Abnormal Gait, Medium Fall Risk, Stiffness   Education / Equipment: Educated on [employer] and continuance of walking program   Plan: Patient agrees to discharge.  Patient goals were partially met. Patient is being discharged due to meeting the stated rehab goals.  ?????        Encounter Date: 08/06/2020   PT End of Session - 08/06/20 1237    Visit Number 15    Number of Visits 15    Date for PT Re-Evaluation 09/02/20   POC for 6 weeks, Cert for 90 days   Authorization Type [employer]    PT Start Time 1231    PT Stop Time 1313    PT Time Calculation (min) 42 min    Equipment Utilized During Treatment Gait belt    Activity Tolerance Patient tolerated treatment well    Behavior During Therapy WFL for tasks assessed/performed           Past Medical History:  Diagnosis Date  . Arthritis    "legs sometimes" (02/03/2014)  . CKD (chronic kidney disease) stage 3, GFR 30-59 ml/min 02/04/2014  . Claudication ([place])   . GERD (gastroesophageal reflux disease)   . Hyperlipidemia   . Hypertension   . Multiple sclerosis ([place]) 10/23/2018   recently dx by dr Leta Baptist   . Neuromuscular disorder ([place])   . PAD (peripheral artery disease) ([place])   . Tobacco abuse    patient says she stopped smoking June 2015    Past Surgical History:  Procedure Laterality Date  . LOWER EXTREMITY ANGIOGRAM N/A 02/03/2014   Procedure: LOWER EXTREMITY ANGIOGRAM;  Surgeon:  Lorretta Harp, MD;  Location: [facility] CATH LAB;  Service: Cardiovascular;  Laterality: N/A;  . PERIPHERAL ATHRECTOMY Right 02/03/2014   w/stent SFA    There were no vitals filed for this visit.   Subjective Assessment - 08/06/20 1235    Subjective Patient reports no new changes. Feeling tired today due to having a busy day.    Pertinent History Arthritis, CKD stage 3, GERD, HTN, Hyperlipidemia, PAD, Multiple Sclerosis    Limitations Standing;Walking    How long can you walk comfortably? 5-10 mins (more than a block)    Patient Stated Goals Function better and be able to move around better    Currently in Pain? No/denies              [facility] PT Assessment - 08/06/20 0001      Functional Gait  Assessment   Gait assessed  Yes    Gait Level Surface Walks 20 ft in less than 7 sec but greater than 5.5 sec, uses assistive device, slower speed, mild gait deviations, or deviates 6-10 in outside of the 12 in walkway width.    Change in Gait Speed Able to change speed, demonstrates mild gait deviations, deviates 6-10 in outside of the 12 in walkway width, or no gait deviations, unable to achieve a major change in velocity, or uses a change in velocity, or uses an assistive device.    Gait with Horizontal Head  Turns Performs head turns smoothly with no change in gait. Deviates no more than 6 in outside 12 in walkway width    Gait with Vertical Head Turns Performs head turns with no change in gait. Deviates no more than 6 in outside 12 in walkway width.    Gait and Pivot Turn Pivot turns safely in greater than 3 sec and stops with no loss of balance, or pivot turns safely within 3 sec and stops with mild imbalance, requires small steps to catch balance.    Step Over Obstacle Is able to step over one shoe box (4.5 in total height) without changing gait speed. No evidence of imbalance.    Gait with Narrow Base of Support Ambulates 4-7 steps.    Gait with Eyes Closed Walks 20 ft, uses assistive device,  slower speed, mild gait deviations, deviates 6-10 in outside 12 in walkway width. Ambulates 20 ft in less than 9 sec but greater than 7 sec.    Ambulating Backwards Walks 20 ft, uses assistive device, slower speed, mild gait deviations, deviates 6-10 in outside 12 in walkway width.    Steps Alternating feet, must use rail.    Total Score 21    FGA comment: 21/30 = Medium Fall Risk                         [facility] Adult PT Treatment/Exercise - 08/06/20 0001      Transfers   Transfers Sit to Stand;Stand to Sit    Sit to Stand 6: Modified independent (Device/Increase time)    Five time sit to stand comments  14.82 secs 5x sit <. stand with UE support from standard chair    Stand to Sit 6: Modified independent (Device/Increase time)      Ambulation/Gait   Ambulation/Gait Yes    Ambulation/Gait Assistance 5: Supervision    Ambulation/Gait Assistance Details completed gait training with personal AFO donned on RLE. Completed ambulating around clinic with supervision    Ambulation Distance (Feet) 115 Feet    Assistive device Straight cane    Gait Pattern Decreased step length - left;Decreased stride length;Trendelenburg;Decreased stance time - right;Decreased step length - right;Decreased hip/knee flexion - right;Decreased dorsiflexion - right;Decreased weight shift to right;Poor foot clearance - right    Ambulation Surface Level;Indoor    Stairs Yes    Stairs Assistance 5: Supervision    Stairs Assistance Details (indicate cue type and reason) completed stairs with R AFO donned    Stair Management Technique One rail Right;Alternating pattern;Forwards    Number of Stairs 4    Height of Stairs 6      Timed Up and Go Test   TUG Normal TUG    Normal TUG (seconds) 14.32      Exercises   Exercises Other Exercises    Other Exercises  Reviewed Final HEP with patient to ensure proper completion and patient compliance with HEP. PT educating on importance of contuining to complete HEP  upon discharge to maintain gains achieved with PT services.                   PT Education - 08/06/20 1316    Education Details Educated on progress toward LTG's, readiness for discharge. Compliance with HEP    Person(s) Educated Patient    Methods Explanation    Comprehension Verbalized understanding            PT Short Term Goals - 07/09/20 1106  PT SHORT TERM GOAL #1   Title STG = LTG's             PT Long Term Goals - 08/06/20 1237      PT LONG TERM GOAL #1   Title Patient will be independent with final HEP    Baseline Patient reports independence with Final HEP    Time 6    Period Weeks    Status Achieved      PT LONG TERM GOAL #2   Title Patient will decrease 5x sit <> stand to <15 seconds w/ UE support to demonstrate improved BLE strength and decrease risk for falls    Baseline 21.29 secs, 05/29/20: 18.21 secs, 14.82 seconds    Time 6    Period Weeks    Status Achieved      PT LONG TERM GOAL #3   Title Patient will decrease TUG < 14 seconds to decrease risk for falls    Baseline 17.99 secs, 05/29/20: 17.22 secs, 15.75 secs on 8/10, 14.32 seconds on 8/19    Time 6    Period Weeks    Status Not Met      PT LONG TERM GOAL #4   Title Patient will improve FGA from 14/30 to >/= 19/30 to demonstrate improved balance and reduced risk for falls.    Baseline 4/26 - 14/30, 6/11 - 15/30, 21/30 on 8/19    Time 6    Period Weeks    Status Achieved      PT LONG TERM GOAL #5   Title Patient will improve gait speed from 1.48 ft/ second to >2.0 ft/sec to demonstrate improve community mobility    Baseline 1.48 ft/sec, 1.79 ft/sec, 2.28 ft/sec on 8/10    Time 6    Period Weeks    Status Achieved                 Plan - 08/06/20 1317    Clinical Impression Statement Today's session included assessment of patient's progress toward all LTG's. Patient able to meet all LTG except TUG Goal with patient completing in 14.32 seconds. Patient has demonstrated  improvements with PT services included improved functional strength, improved balance and reduced risk for falls. PT educating to continue to HEP and walking daily to maintain gains achieved with PT services. PT educating on readiness for discharge with patient verbalizing agreement.    Personal Factors and Comorbidities Comorbidity 3+    Comorbidities Arthritis, CKD stage 3, GERD, HTN, Hyperlipidemia, PAD, Multiple Sclerosis    Examination-Activity Limitations Stand;Squat;Sit    Examination-Participation Restrictions Community Activity;Yard Work    Merchant navy officer Evolving/Moderate complexity    Rehab Potential Good    PT Frequency 1x / week    PT Duration 6 weeks    PT Treatment/Interventions Gait training;Stair training;Functional mobility training;Therapeutic activities;Therapeutic exercise;Neuromuscular re-education;Patient/family education;Cryotherapy;Moist Heat;DME Instruction;Balance training;Orthotic Fit/Training;Manual techniques;Passive range of motion    PT Next Visit Plan --    PT Home Exercise Plan Access Code: [email or web address]    Consulted and Agree with Plan of Care Patient           Patient will benefit from skilled therapeutic intervention in order to improve the following deficits and impairments:  Abnormal gait, Decreased balance, Decreased endurance, Decreased activity tolerance, Decreased range of motion, Hypomobility, Difficulty walking, Impaired flexibility, Decreased strength, Decreased mobility, Improper body mechanics, Pain  Visit Diagnosis: Other abnormalities of gait and mobility  Unsteadiness on feet  Muscle weakness (generalized)  Abnormal posture  Problem List Patient Active Problem List   Diagnosis Date Noted  . Hyperlipidemia 03/10/2015  . PAD (peripheral artery disease), 02/03/14 diamondback rotational atherectomy,PTA stent IDEV supera, mid-rt SFA.  Residual tot. L SFA  02/04/2014  . CKD (chronic kidney disease) stage 3, GFR 30-59  ml/min 02/04/2014  . Anemia 02/04/2014  . Claudication, lifestyle limiting 01/24/2014  . Chest pain at rest 01/09/2012    Class: Acute  . Hypertension 01/09/2012    Class: Chronic    Jones Bales, PT, DPT 08/06/2020, 2:05 PM  [facility] [address] [place], [place], [address] Phone: [phone]   Fax:  [phone]  Name: Ana Edwards MRN: [id number] Date of Birth: 16-Dec-1956

## 2020-08-10 ENCOUNTER — Ambulatory Visit: Payer: 59

## 2020-08-17 ENCOUNTER — Other Ambulatory Visit: Payer: Self-pay | Admitting: Diagnostic Neuroimaging

## 2020-08-19 ENCOUNTER — Ambulatory Visit: Payer: 59 | Admitting: Cardiovascular Disease

## 2020-09-11 ENCOUNTER — Ambulatory Visit: Payer: 59 | Admitting: Cardiovascular Disease

## 2020-10-23 ENCOUNTER — Ambulatory Visit: Payer: 59 | Admitting: Cardiovascular Disease

## 2020-11-20 ENCOUNTER — Ambulatory Visit (INDEPENDENT_AMBULATORY_CARE_PROVIDER_SITE_OTHER): Payer: 59 | Admitting: Otolaryngology

## 2020-11-20 ENCOUNTER — Encounter (INDEPENDENT_AMBULATORY_CARE_PROVIDER_SITE_OTHER): Payer: Self-pay | Admitting: Otolaryngology

## 2020-11-20 ENCOUNTER — Ambulatory Visit: Payer: 59 | Admitting: Cardiovascular Disease

## 2020-11-20 ENCOUNTER — Other Ambulatory Visit: Payer: Self-pay

## 2020-11-20 VITALS — Temp 97.3°F

## 2020-11-20 DIAGNOSIS — H6123 Impacted cerumen, bilateral: Secondary | ICD-10-CM | POA: Diagnosis not present

## 2020-11-20 NOTE — Progress Notes (Signed)
HPI: Ana Edwards is a 64 y.o. female who presents for evaluation of both ears feeling stopped up.  States that hearing will come and go.  She has not had her ears examined..  Past Medical History:  Diagnosis Date  . Arthritis    "legs sometimes" (02/03/2014)  . CKD (chronic kidney disease) stage 3, GFR 30-59 ml/min ([facility]) 02/04/2014  . Claudication ([facility])   . GERD (gastroesophageal reflux disease)   . Hyperlipidemia   . Hypertension   . Multiple sclerosis ([facility]) 10/23/2018   recently dx by dr Marjory Lies   . Neuromuscular disorder ([facility])   . PAD (peripheral artery disease) ([facility])   . Tobacco abuse    patient says she stopped smoking June 2015   Past Surgical History:  Procedure Laterality Date  . LOWER EXTREMITY ANGIOGRAM N/A 02/03/2014   Procedure: LOWER EXTREMITY ANGIOGRAM;  Surgeon: Runell Gess, MD;  Location: [facility] CATH LAB;  Service: Cardiovascular;  Laterality: N/A;  . PERIPHERAL ATHRECTOMY Right 02/03/2014   w/stent SFA   Social History   Socioeconomic History  . Marital status: Single    Spouse name: Not on file  . Number of children: Not on file  . Years of education: Not on file  . Highest education level: Not on file  Occupational History  . Not on file  Tobacco Use  . Smoking status: Former Smoker    Packs/day: 0.12    Years: 18.00    Pack years: 2.16    Types: Cigarettes    Quit date: 01/2020    Years since quitting: 0.8  . Smokeless tobacco: Never Used  . Tobacco comment: < 1 pack per week  Substance and Sexual Activity  . Alcohol use: No    Comment: quit 5 yrs ago,  2014  . Drug use: Never  . Sexual activity: Not Currently  Other Topics Concern  . Not on file  Social History Narrative   Lives wih son,  Works at [employer] ([employer]  AD.  Children 4.  Caffeine, drinks iced tea, and occ soda.   Social Determinants of Health   Financial Resource Strain:   . Difficulty of Paying Living Expenses: Not on file  Food Insecurity:   . Worried About  Programme researcher, broadcasting/film/video in the Last Year: Not on file  . Ran Out of Food in the Last Year: Not on file  Transportation Needs:   . Lack of Transportation (Medical): Not on file  . Lack of Transportation (Non-Medical): Not on file  Physical Activity:   . Days of Exercise per Week: Not on file  . Minutes of Exercise per Session: Not on file  Stress:   . Feeling of Stress : Not on file  Social Connections:   . Frequency of Communication with Friends and Family: Not on file  . Frequency of Social Gatherings with Friends and Family: Not on file  . Attends Religious Services: Not on file  . Active Member of Clubs or Organizations: Not on file  . Attends Banker Meetings: Not on file  . Marital Status: Not on file   Family History  Problem Relation Age of Onset  . Heart disease Mother   . Arthritis Father   . Heart attack Brother    Allergies  Allergen Reactions  . Tetracyclines & Related Nausea And Vomiting and Other (See Comments)    sweats   Prior to Admission medications   Medication Sig Start Date End Date Taking? Authorizing Provider  amLODipine (  NORVASC) 10 MG tablet Take 10 mg by mouth daily. 04/25/20  Yes [provider]  atorvastatin (LIPITOR) 40 MG tablet TAKE ONE TABLET BY MOUTH ONCE DAILY 08/01/17  Yes Rosalio Macadamia, NP  clopidogrel (PLAVIX) 75 MG tablet TAKE ONE TABLET BY MOUTH ONCE DAILY WITH BREAKFAST 12/16/16  Yes Runell Gess, MD  Glatiramer Acetate 40 MG/ML SOSY Inject 40mg  subcutaneously three (3) times per week 08/17/20  Yes Penumalli, 08/19/20, MD  metoprolol (LOPRESSOR) 50 MG tablet Take 25 mg by mouth 2 (two) times daily.    Yes [provider]     Positive ROS: Otherwise negative  All other systems have been reviewed and were otherwise negative with the exception of those mentioned in the HPI and as above.  Physical Exam: Constitutional: Alert, well-appearing, no acute distress Ears: External ears without lesions or  tenderness. Ear canals with a large amount of cerumen buildup in both ear canals that was cleaned with curette suction and forceps.  After cleaning the ear canals both TMs were clear with significantly improved hearing.. Nasal: External nose without lesions. Clear nasal passages Oral: Oropharynx clear. Neck: No palpable adenopathy or masses Respiratory: Breathing comfortably  Skin: No facial/neck lesions or rash noted.  Cerumen impaction removal  Date/Time: 11/20/2020 2:07 PM Performed by: 14/02/2020, MD Authorized by: Drema Halon, MD   Consent:    Consent obtained:  Verbal   Consent given by:  Patient   Risks discussed:  Pain and bleeding Procedure details:    Location:  L ear and R ear   Procedure type: curette, suction and forceps   Post-procedure details:    Inspection:  TM intact and canal normal   Hearing quality:  Improved   Patient tolerance of procedure:  Tolerated well, no immediate complications Comments:     TMs are clear bilaterally.    Assessment: Bilateral cerumen buildup causing hearing problems  Plan: This was cleaned in the office. She will follow-up as needed  Drema Halon, MD

## 2020-12-22 ENCOUNTER — Other Ambulatory Visit: Payer: Self-pay | Admitting: Cardiology

## 2020-12-22 DIAGNOSIS — I739 Peripheral vascular disease, unspecified: Secondary | ICD-10-CM

## 2020-12-30 ENCOUNTER — Other Ambulatory Visit: Payer: Self-pay

## 2020-12-30 ENCOUNTER — Ambulatory Visit (HOSPITAL_COMMUNITY)
Admission: RE | Admit: 2020-12-30 | Discharge: 2020-12-30 | Disposition: A | Discharge status: Home / Self Care | Payer: 59 | Source: Ambulatory Visit | Attending: Cardiology | Admitting: Cardiology

## 2020-12-30 DIAGNOSIS — I739 Peripheral vascular disease, unspecified: Secondary | ICD-10-CM | POA: Diagnosis not present

## 2020-12-31 ENCOUNTER — Telehealth: Payer: Self-pay | Admitting: *Deleted

## 2020-12-31 DIAGNOSIS — G35 Multiple sclerosis: Secondary | ICD-10-CM

## 2020-12-31 MED ORDER — GLATIRAMER ACETATE 40 MG/ML ~~LOC~~ SOSY: PREFILLED_SYRINGE | SUBCUTANEOUS | 5 refills | Status: DC

## 2020-12-31 NOTE — Telephone Encounter (Signed)
Pt called, spoke with [facility], they said they need PA for Glatiramer acetate. I informed Pt of the previous telephone note. Pt would like a call from the nurse to confirm PA has been sent to [facility].

## 2020-12-31 NOTE — Telephone Encounter (Signed)
Called [facility] help desk, spoke with Josh to do PA on glatiramer acetate. He stated that they must have claim rejection on file to do PA on phone. He stated a new Rx needs to be sent to pharmacy so that they will run RX. This will generate PA for medication. It will be faxed over in 72 hours; I can call this # again to initiate PA.

## 2020-12-31 NOTE — Telephone Encounter (Signed)
Called patient and advised I will do PA as soon as pharmacy sends it to me, may be 72 hours. She has 4 injections remaining, Patient verbalized understanding, appreciation.

## 2020-12-31 NOTE — Telephone Encounter (Signed)
Glatiramer acetate PA, key: [id number]. Message: eligibility not found.

## 2021-01-05 NOTE — Telephone Encounter (Signed)
Synetta Fail with [facility] on behalf of [facility] [employer] stating pt wants Glatiramer Acetate 40 MG/ML SOSY called in and not Glatopa that was called in.  Synetta Fail provided their contact info as ph#[phone] fax#[phone]

## 2021-01-05 NOTE — Telephone Encounter (Signed)
I spoke with [facility] @ [facility] pharmacy. They have the Glatiramer Acetate prescription refills on file from 12/31/20. She made a note that it is to be filled as the generic copaxone (pt on generic per last office note June 2021) and they will process this and send PA if one is needed. She verbalized appreciation for the call.

## 2021-01-11 NOTE — Telephone Encounter (Signed)
Pharmacist, Lyda Jester with [facility] specialty pharmacy called asking for clarification to Glatiramer Acetate 40 MG/ML SOSY Rx, stated he received Rx from previous pharmacy and needed to verify it. He repeated  Rx sent in by Dr Marjory Lies on 12/31/20 correctly, stated they would fill Rx and send to patient. Pharmacist verbalized understanding, appreciation.

## 2021-01-13 NOTE — Telephone Encounter (Addendum)
Patient called stating pharmacy called her and told her they needed clinical questions answered re: glatiramer acetate. RN to call [phone]. She only has 1 dose left, due on Fri.  Called #, it was pharmacy help line, spoke with Shanda Bumps and answered clinical questions. She submitted it as urgent, decision turn around unknown.

## 2021-01-20 ENCOUNTER — Other Ambulatory Visit: Payer: Self-pay

## 2021-01-20 ENCOUNTER — Encounter: Payer: Self-pay | Admitting: Cardiovascular Disease

## 2021-01-20 ENCOUNTER — Ambulatory Visit (INDEPENDENT_AMBULATORY_CARE_PROVIDER_SITE_OTHER): Payer: 59 | Admitting: Cardiovascular Disease

## 2021-01-20 DIAGNOSIS — E782 Mixed hyperlipidemia: Secondary | ICD-10-CM | POA: Diagnosis not present

## 2021-01-20 DIAGNOSIS — I10 Essential (primary) hypertension: Secondary | ICD-10-CM | POA: Diagnosis not present

## 2021-01-20 DIAGNOSIS — I739 Peripheral vascular disease, unspecified: Secondary | ICD-10-CM | POA: Diagnosis not present

## 2021-01-20 LAB — LIPID PANEL
Chol/HDL Ratio: 3.7 ratio (ref 0.0–4.4)
Cholesterol, Total: 215 mg/dL — ABNORMAL HIGH (ref 100–199)
HDL: 58 mg/dL (ref >39)
LDL Chol Calc (NIH): 138 mg/dL — ABNORMAL HIGH (ref 0–99)
Triglycerides: 108 mg/dL (ref 0–149)
VLDL Cholesterol Cal: 19 mg/dL (ref 5–40)

## 2021-01-20 LAB — HEPATIC FUNCTION PANEL
ALT: 9 IU/L (ref 0–32)
AST: 17 IU/L (ref 0–40)
Albumin: 4.1 g/dL (ref 3.8–4.8)
Alkaline Phosphatase: 97 IU/L (ref 44–121)
Bilirubin Total: 0.5 mg/dL (ref 0.0–1.2)
Bilirubin, Direct: 0.14 mg/dL (ref 0.00–0.40)
Total Protein: 7 g/dL (ref 6.0–8.5)

## 2021-01-20 MED ORDER — METOPROLOL TARTRATE 50 MG PO TABS: 25.0000 mg | ORAL_TABLET | Freq: Two times a day (BID) | ORAL | 3 refills | Status: DC

## 2021-01-20 MED ORDER — CLOPIDOGREL BISULFATE 75 MG PO TABS: ORAL_TABLET | ORAL | 3 refills | Status: DC

## 2021-01-20 MED ORDER — ATORVASTATIN CALCIUM 40 MG PO TABS: 40.0000 mg | ORAL_TABLET | Freq: Every day | ORAL | 3 refills | Status: DC

## 2021-01-20 MED ORDER — AMLODIPINE BESYLATE 10 MG PO TABS: 10.0000 mg | ORAL_TABLET | Freq: Every day | ORAL | 3 refills | Status: DC

## 2021-01-20 NOTE — Patient Instructions (Signed)
Medication Instructions:  Your physician recommends that you continue on your current medications as directed. Please refer to the Current Medication list given to you today.  *If you need a refill on your cardiac medications before your next appointment, please call your pharmacy*   Lab Work: Your physician recommends that you have labs drawn today: lipid/liver profile  If you have labs (blood work) drawn today and your tests are completely normal, you will receive your results only by: Marland Kitchen MyChart Message (if you have MyChart) OR . A paper copy in the mail If you have any lab test that is abnormal or we need to change your treatment, we will call you to review the results.   Testing/Procedures: Your physician has requested that you have a lower extremity arterial duplex. This test is an ultrasound of the arteries in the legs. It looks at arterial blood flow in the legs. Allow one hour for Lower Arterial scans. There are no restrictions or special instructions. This procedure is done at [address] [facility]. 2nd Floor - To be done Jan 2023.  Your physician has requested that you have an ankle brachial index (ABI). During this test an ultrasound and blood pressure cuff are used to evaluate the arteries that supply the arms and legs with blood. Allow thirty minutes for this exam. There are no restrictions or special instructions. This procedure is done at [address] [facility]. 2nd Floor - To be done Jan 2023.   Follow-Up: At [facility], you and your health needs are our priority.  As part of our continuing mission to provide you with exceptional heart care, we have created designated Provider Care Teams.  These Care Teams include your primary Cardiologist (physician) and Advanced Practice Providers (APPs -  Physician Assistants and Nurse Practitioners) who all work together to provide you with the care you need, when you need it.  We recommend signing up for the patient portal called "MyChart".   Sign up information is provided on this After Visit Summary.  MyChart is used to connect with patients for Virtual Visits (Telemedicine).  Patients are able to view lab/test results, encounter notes, upcoming appointments, etc.  Non-urgent messages can be sent to your provider as well.   To learn more about what you can do with MyChart, go to [email or web address].    Your next appointment:   12 month(s)  The format for your next appointment:   In Person  Provider:   Nanetta Batty, MD

## 2021-01-20 NOTE — Progress Notes (Signed)
01/20/2021 Ana Edwards   06/08/1956  [id number]  Primary Physician Orpah Cobb, MD Primary Cardiologist: Runell Gess MD Nicholes Calamity, [place]  HPI:  Ana Edwards is a 65 y.o.  moderately overweight divorced African American female mother of 4, grandmother who was referred by Dr. Santiago Bumpers for peripheral vascular evaluation because of life style limiting claudication. I last saw her in the office 09/21/2018. Her risk factors include discontinued tobacco abuse and hypertension. She's not diabetic nor is a family history of heart disease. She has never had a heart attack or stroke patient denies chest pain or shortness of breath. 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 The patient relates Progressive symptoms over the last 6 months. Left is greater than right. She presents today for angiography and potential intervention. The Doppler suggests an occluded left SFA with high-grade right SFA disease. I angiogram to her on 02/03/14 revealing an occluded left SFA at its origin reconstituting in the abductor canal with high-grade calcified right SFA stenosis. I ultimately performed diamondback orbital rotational atherectomy, PTCA and stenting using an IDET V stent resulting in excellent and clinical result. She had 1 vessel runoff below the knee the peroneal artery.Her ABI increased from 0.69 to .76 with resolution of her claudication on the right. She currently denies claudication.  Since I saw her 2 years ago she is remained stable.  She did stop smoking but ultimately went back to smoking and is recently stopped once again.  She has been diagnosed with multiple sclerosis of last year and is being treated by a neurologist.  She continues to deny claudication.  She also denies chest pain or shortness of breath.  Her recent Doppler studies performed 12/30/2020 revealed stable right ABI with a patent right SFA  stent.    Current Meds  Medication Sig  . amLODipine (NORVASC) 10 MG tablet Take 10 mg by mouth daily.  Marland Kitchen atorvastatin (LIPITOR) 40 MG tablet TAKE ONE TABLET BY MOUTH ONCE DAILY  . clopidogrel (PLAVIX) 75 MG tablet TAKE ONE TABLET BY MOUTH ONCE DAILY WITH BREAKFAST  . Glatiramer Acetate 40 MG/ML SOSY Inject 40mg  subcutaneously three (3) times per week  . metoprolol (LOPRESSOR) 50 MG tablet Take 25 mg by mouth 2 (two) times daily.      Allergies  Allergen Reactions  . Tetracyclines & Related Nausea And Vomiting and Other (See Comments)    sweats    Social History   Socioeconomic History  . Marital status: Single    Spouse name: Not on file  . Number of children: Not on file  . Years of education: Not on file  . Highest education level: Not on file  Occupational History  . Not on file  Tobacco Use  . Smoking status: Former Smoker    Packs/day: 0.12    Years: 18.00    Pack years: 2.16    Types: Cigarettes    Quit date: 01/2020    Years since quitting: 1.0  . Smokeless tobacco: Never Used  . Tobacco comment: < 1 pack per week  Substance and Sexual Activity  . Alcohol use: No    Comment: quit 5 yrs ago,  2014  . Drug use: Never  . Sexual activity: Not Currently  Other Topics Concern  . Not on file  Social History Narrative   Lives wih son,  Works at [employer] ([employer]).  AD.  Children 4.  Caffeine, drinks iced tea, and occ soda.   Social Determinants of Health   Financial Resource Strain: Not on file  Food Insecurity: Not on file  Transportation Needs: Not on file  Physical Activity: Not on file  Stress: Not on file  Social Connections: Not on file  Intimate Partner Violence: Not on file     Review of Systems: General: negative for chills, fever, night sweats or weight changes.  Cardiovascular: negative for chest pain, dyspnea on exertion, edema, orthopnea, palpitations, paroxysmal nocturnal dyspnea or shortness of breath Dermatological: negative for  rash Respiratory: negative for cough or wheezing Urologic: negative for hematuria Abdominal: negative for nausea, vomiting, diarrhea, bright red blood per rectum, melena, or hematemesis Neurologic: negative for visual changes, syncope, or dizziness All other systems reviewed and are otherwise negative except as noted above.    Blood pressure 138/64, pulse (!) 59, height 5' 10.5" (1.791 m), weight 170 lb (77.1 kg).  General appearance: alert and no distress Neck: no adenopathy, no carotid bruit, no JVD, supple, symmetrical, trachea midline and thyroid not enlarged, symmetric, no tenderness/mass/nodules Lungs: clear to auscultation bilaterally Heart: regular rate and rhythm, S1, S2 normal, no murmur, click, rub or gallop Extremities: extremities normal, atraumatic, no cyanosis or edema Pulses: 2+ and symmetric Diminished pedal pulses bilaterally Skin: Skin color, texture, turgor normal. No rashes or lesions Neurologic: Alert and oriented X 3, normal strength and tone. Normal symmetric reflexes. Normal coordination and gait  EKG sinus bradycardia 59 with nonspecific ST and T wave changes.  I personally reviewed this EKG.  ASSESSMENT AND PLAN:   Hypertension History of essential hypertension a blood pressure measured today 138/64.  She is on amlodipine and metoprolol.  PAD (peripheral artery disease), 02/03/14 diamondback rotational atherectomy,PTA stent IDEV supera, mid-rt SFA.  Residual tot. L SFA  History of peripheral arterial disease status post right SFA intervention by myself 02/03/2014 with orbital atherectomy, PTA and stenting using a 9 DEV stent with an excellent result.  She did have one-vessel runoff below the knee via peroneal artery.  Her ABI increased from 0.69-0.76 and her claudication resolved.  She did have a total left SFA but does not have claudication on that side.  Doppler studies performed 12/30/2020 revealed a right ABI of 0.81 which was stable and a left of 0.60.  Right  SFA stent appears to be widely patent.  We will continue to follow her noninvasively.  Hyperlipidemia History of hyperlipidemia on statin therapy.  We will check a lipid liver profile today.      Runell Gess MD FACP,FACC,FAHA, [facility] 01/20/2021 11:06 AM

## 2021-01-20 NOTE — Assessment & Plan Note (Signed)
History of essential hypertension a blood pressure measured today 138/64.  She is on amlodipine and metoprolol.

## 2021-01-20 NOTE — Assessment & Plan Note (Signed)
History of peripheral arterial disease status post right SFA intervention by myself 02/03/2014 with orbital atherectomy, PTA and stenting using a 9 DEV stent with an excellent result.  She did have one-vessel runoff below the knee via peroneal artery.  Her ABI increased from 0.69-0.76 and her claudication resolved.  She did have a total left SFA but does not have claudication on that side.  Doppler studies performed 12/30/2020 revealed a right ABI of 0.81 which was stable and a left of 0.60.  Right SFA stent appears to be widely patent.  We will continue to follow her noninvasively.

## 2021-01-20 NOTE — Assessment & Plan Note (Signed)
History of hyperlipidemia on statin therapy.  We will check a lipid liver profile today. 

## 2021-02-12 ENCOUNTER — Telehealth: Payer: Self-pay

## 2021-02-12 DIAGNOSIS — E782 Mixed hyperlipidemia: Secondary | ICD-10-CM

## 2021-02-12 MED ORDER — ATORVASTATIN CALCIUM 80 MG PO TABS: 80.0000 mg | ORAL_TABLET | Freq: Every day | ORAL | 3 refills | Status: DC

## 2021-02-12 MED ORDER — EZETIMIBE 10 MG PO TABS: 10.0000 mg | ORAL_TABLET | Freq: Every day | ORAL | 3 refills | Status: DC

## 2021-02-12 NOTE — Telephone Encounter (Signed)
Called pt regarding lipid panel. Explained to pt about her LDL and changes made by Dr. Allyson Sabal on her medications.  Pt is increasing atorvastatin to 80mg  daily and adding zetia 10mg  to her medications. Advised pt that we would check labs in 2-3 months to see how those medication changes are working for her. Pt verbalizes understanding.

## 2021-03-01 ENCOUNTER — Telehealth: Payer: Self-pay | Admitting: Diagnostic Neuroimaging

## 2021-03-01 NOTE — Telephone Encounter (Signed)
Pt. is requesting a call from RN. 

## 2021-03-01 NOTE — Telephone Encounter (Signed)
Called patient who stated her insurance changes 03-21-21, [employer] stops and [facility] medicare starts. She is asking if Medicare will pay for her glatiramer acetate.  I advised her I'll have to resubmit PA for her medicine after 03/21/21 with new insurance information. She has FU next week, will bring new card then. She has enough medicine, will reorder in next few days before her insurance expires.

## 2021-03-09 ENCOUNTER — Ambulatory Visit: Payer: 59 | Admitting: Diagnostic Neuroimaging

## 2021-03-09 ENCOUNTER — Encounter: Payer: Self-pay | Admitting: Diagnostic Neuroimaging

## 2021-03-09 VITALS — BP 138/72 | HR 68 | Ht 70.5 in | Wt 169.0 lb

## 2021-03-09 DIAGNOSIS — R269 Unspecified abnormalities of gait and mobility: Secondary | ICD-10-CM | POA: Diagnosis not present

## 2021-03-09 DIAGNOSIS — G35 Multiple sclerosis: Secondary | ICD-10-CM

## 2021-03-09 NOTE — Progress Notes (Signed)
[facility]  PATIENT: Ana Edwards DOB: 04/14/1956  REFERRING CLINICIAN: Dixie Dials, MD  HISTORY FROM: patient  REASON FOR VISIT: follow up   HISTORICAL  CHIEF COMPLAINT:  Chief Complaint  Patient presents with  . Multiple Sclerosis    Rm 7, 8 months FU "doing well"    HISTORY OF PRESENT ILLNESS:   UPDATE (03/09/21, VRP): Since last visit, doing well. Symptoms are stable. No alleviating or aggravating factors. Tolerating meds.    UPDATE (06/09/20, VRP): Since last visit, doing WELL. Symptoms are stable. Working with PT and planning to get AFO to help gait and stability on right leg. No alleviating or aggravating factors. Tolerating meds. Had covid vaccine (no issues).   UPDATE (09/10/19, VRP): Since last visit, doing well. Symptoms are stable. Severity is mild. No alleviating or aggravating factors. Tolerating copaxone (generic).    UPDATE (03/05/19, VRP): Since last visit, doing about the same. Having some confusion and diff getting started on ocrevus. Symptoms are stable. Severity is mild. No alleviating or aggravating factors.    UPDATE (10/23/18, VRP): Since last visit, doing about the same. Symptoms are stable. Severity is moderate. No alleviating or aggravating factors. MRI scans reviewed. Right > Left leg stiffness continues. Denies any vision changes, slurred speech, hand problems.  PRIOR HPI (07/16/18): 65 year old female here for evaluation of lower extremity stiffness.  Patient reports at least one year of lower extremity stiffness and difficulty walking.  I spoke with patient's daughter via phone who tells me that symptoms have been going on for at least 3 years, progressively worsening.  Patient feels some increased stiffness in her right greater than left leg.  Symptoms are worse when is cold outside.  Symptoms are slightly improved when it is warmer patient has been moving around for a while.  She denies any numbness or tingling.  Patient fell down  3 years ago due to this problem.  She also fell down 2 years ago off of her front steps.    Patient denies any bowel or bladder incontinence.  She denies any problems with her arms or hands.  No slurred speech, vision changes or trouble talking.  Saw orthopedic clinic for evaluation.  X-rays and lab work were tested and apparently unremarkable.   REVIEW OF SYSTEMS: Full 14 system review of systems performed and negative with exception of: MUSCLE CRAMPS WALKING DIFF.  ALLERGIES: Allergies  Allergen Reactions  . Tetracyclines & Related Nausea And Vomiting and Other (See Comments)    sweats    HOME MEDICATIONS: Outpatient Medications Prior to Visit  Medication Sig Dispense Refill  . amLODipine (NORVASC) 10 MG tablet Take 1 tablet (10 mg total) by mouth daily. 90 tablet 3  . atorvastatin (LIPITOR) 80 MG tablet Take 1 tablet (80 mg total) by mouth daily. 90 tablet 3  . clopidogrel (PLAVIX) 75 MG tablet TAKE ONE TABLET BY MOUTH ONCE DAILY WITH BREAKFAST 90 tablet 3  . ezetimibe (ZETIA) 10 MG tablet Take 1 tablet (10 mg total) by mouth daily. 90 tablet 3  . Glatiramer Acetate 40 MG/ML SOSY Inject 34m subcutaneously three (3) times per week 12 mL 5  . metoprolol tartrate (LOPRESSOR) 25 MG tablet Take 12.5 mg by mouth 2 (two) times daily.    . metoprolol tartrate (LOPRESSOR) 50 MG tablet Take 0.5 tablets (25 mg total) by mouth 2 (two) times daily. 90 tablet 3   No facility-administered medications prior to visit.    PAST MEDICAL HISTORY: Past Medical History:  Diagnosis  Date  . Arthritis    "legs sometimes" (02/03/2014)  . CKD (chronic kidney disease) stage 3, GFR 30-59 ml/min (HCC) 02/04/2014  . Claudication ([place])   . GERD (gastroesophageal reflux disease)   . Hyperlipidemia   . Hypertension   . Multiple sclerosis ([place]) 10/23/2018   recently dx by dr Leta Baptist   . Neuromuscular disorder ([place])   . PAD (peripheral artery disease) ([place])   . Tobacco abuse    patient says she stopped  smoking June 2015    PAST SURGICAL HISTORY: Past Surgical History:  Procedure Laterality Date  . LOWER EXTREMITY ANGIOGRAM N/A 02/03/2014   Procedure: LOWER EXTREMITY ANGIOGRAM;  Surgeon: Lorretta Harp, MD;  Location[facility] CATH LAB;  Service: Cardiovascular;  Laterality: N/A;  . PERIPHERAL ATHRECTOMY Right 02/03/2014   w/stent SFA    FAMILY HISTORY: Family History  Problem Relation Age of Onset  . Heart disease Mother   . Arthritis Father   . Heart attack Brother     SOCIAL HISTORY: Social History   Socioeconomic History  . Marital status: Single    Spouse name: Not on file  . Number of children: Not on file  . Years of education: Not on file  . Highest education level: Not on file  Occupational History  . Not on file  Tobacco Use  . Smoking status: Former Smoker    Packs/day: 0.12    Years: 18.00    Pack years: 2.16    Types: Cigarettes    Quit date: 01/2020    Years since quitting: 1.1  . Smokeless tobacco: Never Used  . Tobacco comment: < 1 pack per week  Substance and Sexual Activity  . Alcohol use: No    Comment: quit 5 yrs ago,  2014  . Drug use: Never  . Sexual activity: Not Currently  Other Topics Concern  . Not on file  Social History Narrative   Lives wih son,  Works at [employer] ([employer]  AD.  Children 4.  Caffeine, drinks iced tea, and occ soda.   Social Determinants of Health   Financial Resource Strain: Not on file  Food Insecurity: Not on file  Transportation Needs: Not on file  Physical Activity: Not on file  Stress: Not on file  Social Connections: Not on file  Intimate Partner Violence: Not on file     PHYSICAL EXAM  GENERAL EXAM/CONSTITUTIONAL: Vitals:  Vitals:   03/09/21 1105  BP: 138/72  Pulse: 68  Weight: 169 lb (76.7 kg)  Height: 5' 10.5" (1.791 m)   Body mass index is 23.91 kg/m. Wt Readings from Last 3 Encounters:  03/09/21 169 lb (76.7 kg)  01/20/21 170 lb (77.1 kg)  06/09/20 175 lb (79.4 kg)    Patient is  in no distress; well developed, nourished and groomed; neck is supple  CARDIOVASCULAR:  Examination of carotid arteries is normal; no carotid bruits  Regular rate and rhythm, no murmurs  Examination of peripheral vascular system by observation and palpation is normal  EYES:  Ophthalmoscopic exam of optic discs and posterior segments is normal; no papilledema or hemorrhages No exam data present  MUSCULOSKELETAL:  Gait, strength, tone, movements noted in Neurologic exam below  NEUROLOGIC: MENTAL STATUS:  No flowsheet data found.  awake, alert, oriented to person, place and time  recent and remote memory intact  normal attention and concentration  language fluent, comprehension intact, naming intact  fund of knowledge appropriate  CRANIAL NERVE:   2nd - no papilledema on fundoscopic  exam  2nd, 3rd, 4th, 6th - pupils equal and reactive to light, visual fields full to confrontation, extraocular muscles intact, no nystagmus  5th - facial sensation symmetric  7th - facial strength symmetric  8th - hearing intact  9th - palate elevates symmetrically, uvula midline  11th - shoulder shrug symmetric  12th - tongue protrusion midline  MOTOR:   BUE 5/5, normal tone  BLE INCREASED TONE; 5 PROX; RIGHT DF 4  SENSORY:   DECR IN RIGHT FOOT  OTHER WISE normal and symmetric to light touch, temperature, vibration  COORDINATION:   finger-nose-finger, fine finger movements normal  REFLEXES:   deep tendon reflexes --> BUE 2; KNEES 2, ANKLES 1  GAIT/STATION:   MODERATE SPASTIC GAIT; UNSTEADY; USES CANE     DIAGNOSTIC DATA (LABS, IMAGING, TESTING) - I reviewed patient records, labs, notes, testing and imaging myself where available.  Lab Results  Component Value Date   WBC 6.1 10/23/2018   HGB 13.1 10/23/2018   HCT 39.5 10/23/2018   MCV 84 10/23/2018   PLT 238 10/23/2018      Component Value Date/Time   NA 144 10/23/2018 1121   K 4.6 10/23/2018 1121    CL 107 (H) 10/23/2018 1121   CO2 23 10/23/2018 1121   GLUCOSE 108 (H) 10/23/2018 1121   GLUCOSE 90 03/18/2016 1515   BUN 14 10/23/2018 1121   CREATININE 1.26 (H) 10/23/2018 1121   CREATININE 1.26 (H) 03/18/2016 1515   CALCIUM 9.6 10/23/2018 1121   PROT 7.0 01/20/2021 1130   ALBUMIN 4.1 01/20/2021 1130   AST 17 01/20/2021 1130   ALT 9 01/20/2021 1130   ALKPHOS 97 01/20/2021 1130   BILITOT 0.5 01/20/2021 1130   GFRNONAA 46 (L) 10/23/2018 1121   GFRAA 53 (L) 10/23/2018 1121   Lab Results  Component Value Date   CHOL 215 (H) 01/20/2021   HDL 58 01/20/2021   LDLCALC 138 (H) 01/20/2021   TRIG 108 01/20/2021   CHOLHDL 3.7 01/20/2021   No results found for: HGBA1C No results found for: VITAMINB12 Lab Results  Component Value Date   TSH 1.83 03/18/2016   Vit D, 25-Hydroxy  Date Value Ref Range Status  10/23/2018 16.1 (L) 30.0 - 100.0 ng/mL Final    Comment:    Vitamin D deficiency has been defined by the [place] and an Endocrine Society practice guideline as a level of serum 25-OH vitamin D less than 20 ng/mL (1,2). The Endocrine Society went on to further define vitamin D insufficiency as a level between 21 and 29 ng/mL (2). 1. IOM (Institute of Medicine). 2010. Dietary reference    intakes for calcium and D. [place]: The    [employer]. 2. Holick MF, Binkley Newberry, Bischoff-Ferrari HA, et al.    Evaluation, treatment, and prevention of vitamin D    deficiency: an Endocrine Society clinical practice    guideline. JCEM. 2011 Jul; 96(7):1911-30.     05/02/18 Labs (Requested and reviewed from Dr. Delilah Shan office):  ANA - negative CRP 0.9 ESR 11 TSH 1.91 CCP < 16 RF < 14  07/17/18 MRI thoracic spine (with and without) [I reviewed images myself and agree with interpretation. -VRP]  - Mild disc bulging at T2-3, T5-6, T7-8. No spinal stenosis or foraminal narrowing.  - No intrinsic, compressive or abnormal enhancing spinal cord lesions. -  Incidental right renal cyst (2.5cm).   07/17/18 MRI lumbar spine (without) [I reviewed images myself and agree with interpretation. -VRP]  - At  L4-5: disc bulging and facet hypertrophy and ligamentum flavum hypertrophy with mild spinal stenosis and moderate biforaminal stenosis - At L3-4: disc bulging and facet hypertrophy with mild spinal stenosis and mild biforaminal stenosis - At L2-3: disc bulging and facet hypertrophy with mild biforaminal stenosis  10/18/18 MRI brain (with and without) [I reviewed images myself and agree with interpretation. -VRP]  - Multiple round and ovoid, confluent, periventricular, subcortical, cerebellar T2 hyperintensities.  Some of these are hypointense on T1 views. No abnormal lesions are seen on post contrast views. Considerations include autoimmune, inflammatory, demyelinating, post-infectious, or ischemic etiologies.  - See MRI cervical spine results from same day.  The presence of spinal cord lesions raises possibility of demyelinating disease.  10/18/18 MRI cervical (with and without) [I reviewed images myself and agree with interpretation. -VRP]  - The spinal cord is notable for multiple T2 and STIR hyperintense lesions at C2, C3, C5 and T2.  In combination with MRI brain findings from same day, this raises possibility of chronic demyelinating plaques.  Other autoimmune and inflammatory or parainfectious conditions are also possible. - Mild degenerative changes as noted above.    ASSESSMENT AND PLAN  65 y.o. year old female here with progressive muscle stiffness, gait difficulty, lower extremely weakness since 2016; history, exam and MRI consistent with RRMS.    Dx: CNS autoimmune / inflamm (brain, cervical)  1. Multiple sclerosis ([place])   2. Gait difficulty      PLAN:  MULTIPLE SCLEROSIS (RRMS; brain and spinal cord lesions) - LEG STIFFNESS / WEAKNESS (stable) - on generic copaxone; doing well; may consider ocrevus in future - optimize nutrition,  exercise, sleep - stop smoking - optimize vitamin D levels  Return in about 1 year (around 03/09/2022).    Penni Bombard, MD 8/45/3646, 80:32 AM Certified in Neurology, Neurophysiology and Neuroimaging  [facility] [address] [place], [place] [address] [phone]

## 2021-03-25 ENCOUNTER — Telehealth: Payer: Self-pay | Admitting: Diagnostic Neuroimaging

## 2021-03-25 NOTE — Telephone Encounter (Signed)
Called [facility] dept, spoke with Bradly [name] who stated she has new insurance account as of 03/19/21. Rx needs new PA, he stated it doesn't need PA if 12 syringes for 28 days.   Member ID # [id number], medicare part d, part b, AARP complete.  Called [facility], was told Rx no longer through them. Advised I call [facility]. [facility], spoke with Sabas Sous who stated they advised patient they need active Rx on file for her, no PA needed. She transferred me to pharmacist to give verbal order. Spoke with Thurston Hole, gave her verbal per Dr Richrd Humbles last Rx: glatiramer acetate 40 mg/mL,dispense one Box of 12, 5 refills, whisper jet included. Patient will need to call and schedule her delivery. Called her to advise of above. Patient verbalized understanding, appreciation.

## 2021-03-25 NOTE — Telephone Encounter (Signed)
Pt is being told that a PA is needed for her mail order of Glatiramer Acetate 40 MG/ML SOSY. Pt's mail order pharmacy -[facility] [phone]

## 2021-07-15 ENCOUNTER — Telehealth: Payer: Self-pay | Admitting: Cardiovascular Disease

## 2021-07-15 NOTE — Telephone Encounter (Signed)
Returned call to patient left message on personal voice mail to call PCP for covid antiviral medication.

## 2021-07-15 NOTE — Telephone Encounter (Signed)
Attempted to contact patient to discuss.  Unable to reach patient, received "call can not be completed at this time" will try again later.

## 2021-07-15 NOTE — Telephone Encounter (Signed)
Patient is returning call.  °

## 2021-07-15 NOTE — Telephone Encounter (Signed)
Pt is calling she tested positive for COVID and wants to know can she be prescribed a medication

## 2021-08-13 ENCOUNTER — Other Ambulatory Visit: Payer: Self-pay | Admitting: Diagnostic Neuroimaging

## 2021-08-13 DIAGNOSIS — G35 Multiple sclerosis: Secondary | ICD-10-CM

## 2021-10-19 ENCOUNTER — Telehealth: Payer: Self-pay | Admitting: *Deleted

## 2021-10-19 NOTE — Telephone Encounter (Signed)
Received fax from [facility] re: they've been unable to reach her to schedule shipment of glatiramer. I called patient who stated she still has 1 unopened box. I gave her their #; she stated she'd call, verbalized understanding, appreciation.

## 2021-11-04 ENCOUNTER — Telehealth: Payer: Self-pay | Admitting: Cardiovascular Disease

## 2021-11-04 NOTE — Telephone Encounter (Signed)
   Pt said she is having chest congestion, denied pain and SOB, however, she said when she cough her head hurts.

## 2021-11-05 NOTE — Telephone Encounter (Signed)
Called pt, no answer. Busy tone, unable to leave a message.

## 2021-11-08 NOTE — Telephone Encounter (Signed)
Unable to reach pt or leave a message  

## 2021-12-30 ENCOUNTER — Ambulatory Visit (HOSPITAL_COMMUNITY)
Admission: RE | Admit: 2021-12-30 | Discharge: 2021-12-30 | Disposition: A | Discharge status: Home / Self Care | Payer: Medicare Other | Source: Ambulatory Visit | Attending: Cardiology | Admitting: Cardiology

## 2021-12-30 ENCOUNTER — Other Ambulatory Visit: Payer: Self-pay

## 2021-12-30 DIAGNOSIS — E782 Mixed hyperlipidemia: Secondary | ICD-10-CM

## 2021-12-30 DIAGNOSIS — I739 Peripheral vascular disease, unspecified: Secondary | ICD-10-CM | POA: Insufficient documentation

## 2021-12-30 LAB — LIPID PANEL
Chol/HDL Ratio: 2 ratio (ref 0.0–4.4)
Cholesterol, Total: 122 mg/dL (ref 100–199)
HDL: 60 mg/dL (ref >39)
LDL Chol Calc (NIH): 49 mg/dL (ref 0–99)
Triglycerides: 63 mg/dL (ref 0–149)
VLDL Cholesterol Cal: 13 mg/dL (ref 5–40)

## 2021-12-30 LAB — HEPATIC FUNCTION PANEL
ALT: 11 IU/L (ref 0–32)
AST: 14 IU/L (ref 0–40)
Albumin: 4 g/dL (ref 3.8–4.8)
Alkaline Phosphatase: 89 IU/L (ref 44–121)
Bilirubin Total: 0.4 mg/dL (ref 0.0–1.2)
Bilirubin, Direct: 0.16 mg/dL (ref 0.00–0.40)
Total Protein: 6.6 g/dL (ref 6.0–8.5)

## 2022-01-16 ENCOUNTER — Other Ambulatory Visit: Payer: Self-pay | Admitting: Diagnostic Neuroimaging

## 2022-01-16 DIAGNOSIS — G35 Multiple sclerosis: Secondary | ICD-10-CM

## 2022-02-03 ENCOUNTER — Other Ambulatory Visit: Payer: Self-pay | Admitting: Cardiovascular Disease

## 2022-02-04 ENCOUNTER — Ambulatory Visit: Payer: Medicare Other | Admitting: Cardiovascular Disease

## 2022-02-09 ENCOUNTER — Ambulatory Visit: Payer: Medicare Other | Admitting: Cardiovascular Disease

## 2022-03-15 ENCOUNTER — Telehealth: Payer: Self-pay | Admitting: Diagnostic Neuroimaging

## 2022-03-15 ENCOUNTER — Telehealth: Payer: Self-pay | Admitting: *Deleted

## 2022-03-15 ENCOUNTER — Encounter: Payer: Self-pay | Admitting: Diagnostic Neuroimaging

## 2022-03-15 ENCOUNTER — Ambulatory Visit: Payer: Medicare Other | Admitting: Diagnostic Neuroimaging

## 2022-03-15 VITALS — BP 137/66 | HR 59 | Ht 70.5 in | Wt 160.4 lb

## 2022-03-15 DIAGNOSIS — G35 Multiple sclerosis: Secondary | ICD-10-CM | POA: Diagnosis not present

## 2022-03-15 DIAGNOSIS — R269 Unspecified abnormalities of gait and mobility: Secondary | ICD-10-CM

## 2022-03-15 NOTE — Progress Notes (Signed)
? ?[facility] ? ?PATIENT: Ana Edwards ?DOB: 1956-04-17 ? ?REFERRING CLINICIAN: Orpah Cobb, MD  ?HISTORY FROM: patient  ?REASON FOR VISIT: follow up ? ? ?HISTORICAL ? ?CHIEF COMPLAINT:  ?Chief Complaint  ?Patient presents with  ? Multiple Sclerosis  ?  Rm 7, one year FU "doing well, work 2-3 x a week"  ? ? ?HISTORY OF PRESENT ILLNESS:  ? ?UPDATE (03/15/22, VRP): Since last visit, doing well. Symptoms are stable. Severity is mild. No alleviating or aggravating factors. Tolerating generic copaxone.   ? ?UPDATE (03/09/21, VRP): Since last visit, doing well. Symptoms are stable. No alleviating or aggravating factors. Tolerating meds.   ? ?UPDATE (06/09/20, VRP): Since last visit, doing WELL. Symptoms are stable. Working with PT and planning to get AFO to help gait and stability on right leg. No alleviating or aggravating factors. Tolerating meds. Had covid vaccine (no issues).  ? ?UPDATE (09/10/19, VRP): Since last visit, doing well. Symptoms are stable. Severity is mild. No alleviating or aggravating factors. Tolerating copaxone (generic).   ? ?UPDATE (03/05/19, VRP): Since last visit, doing about the same. Having some confusion and diff getting started on ocrevus. Symptoms are stable. Severity is mild. No alleviating or aggravating factors.   ? ?UPDATE (10/23/18, VRP): Since last visit, doing about the same. Symptoms are stable. Severity is moderate. No alleviating or aggravating factors. MRI scans reviewed. Right > Left leg stiffness continues. Denies any vision changes, slurred speech, hand problems. ? ?PRIOR HPI (07/16/18): 66 year old female here for evaluation of lower extremity stiffness. ? ?Patient reports at least one year of lower extremity stiffness and difficulty walking.  I spoke with patient's daughter via phone who tells me that symptoms have been going on for at least 3 years, progressively worsening.  Patient feels some increased stiffness in her right greater than left leg.  Symptoms  are worse when is cold outside.  Symptoms are slightly improved when it is warmer patient has been moving around for a while.  She denies any numbness or tingling.  Patient fell down 3 years ago due to this problem.  She also fell down 2 years ago off of her front steps.   ? ?Patient denies any bowel or bladder incontinence.  She denies any problems with her arms or hands.  No slurred speech, vision changes or trouble talking. ? ?Saw orthopedic clinic for evaluation.  X-rays and lab work were tested and apparently unremarkable. ? ? ?REVIEW OF SYSTEMS: Full 14 system review of systems performed and negative with exception of: as per HPI. ? ?ALLERGIES: ?Allergies  ?Allergen Reactions  ? Tetracyclines & Related Nausea And Vomiting and Other (See Comments)  ?  sweats  ? ? ?HOME MEDICATIONS: ?Outpatient Medications Prior to Visit  ?Medication Sig Dispense Refill  ? amLODipine (NORVASC) 10 MG tablet Take 1 tablet (10 mg total) by mouth daily. 90 tablet 3  ? atorvastatin (LIPITOR) 80 MG tablet Take 1 tablet (80 mg total) by mouth daily. 90 tablet 3  ? clopidogrel (PLAVIX) 75 MG tablet Take 1 tablet by mouth once daily with breakfast 90 tablet 0  ? ezetimibe (ZETIA) 10 MG tablet Take 1 tablet (10 mg total) by mouth daily. 90 tablet 3  ? Glatiramer Acetate 40 MG/ML SOSY INJECT 40MG  SUBCUTANEOUSLY  THREE TIMES WEEKLY AT LEAST 48 HOURS APART 12 mL 2  ? metoprolol tartrate (LOPRESSOR) 25 MG tablet Take 12.5 mg by mouth 2 (two) times daily.    ? ?No facility-administered medications prior to visit.  ? ? ?  PAST MEDICAL HISTORY: ?Past Medical History:  ?Diagnosis Date  ? Arthritis   ? "legs sometimes" (02/03/2014)  ? CKD (chronic kidney disease) stage 3, GFR 30-59 ml/min (HCC) 02/04/2014  ? Claudication [facility])   ? GERD (gastroesophageal reflux disease)   ? Hyperlipidemia   ? Hypertension   ? Multiple sclerosis (HCC) 10/23/2018  ? recently dx by dr Marjory Lies   ? Neuromuscular disorder (HCC)   ? PAD (peripheral artery disease) (HCC)   ?  Tobacco abuse   ? patient says she stopped smoking June 2015  ? ? ?PAST SURGICAL HISTORY: ?Past Surgical History:  ?Procedure Laterality Date  ? LOWER EXTREMITY ANGIOGRAM N/A 02/03/2014  ? Procedure: LOWER EXTREMITY ANGIOGRAM;  Surgeon: Runell Gess, MD;  Location: [facility] CATH LAB;  Service: Cardiovascular;  Laterality: N/A;  ? PERIPHERAL ATHRECTOMY Right 02/03/2014  ? w/stent SFA  ? ? ?FAMILY HISTORY: ?Family History  ?Problem Relation Age of Onset  ? Heart disease Mother   ? Arthritis Father   ? Heart attack Brother   ? ? ?SOCIAL HISTORY: ?Social History  ? ?Socioeconomic History  ? Marital status: Single  ?  Spouse name: Not on file  ? Number of children: 4  ? Years of education: Not on file  ? Highest education level: Not on file  ?Occupational History  ? Not on file  ?Tobacco Use  ? Smoking status: Former  ?  Packs/day: 0.12  ?  Years: 18.00  ?  Pack years: 2.16  ?  Types: Cigarettes  ?  Quit date: 01/2020  ?  Years since quitting: 2.1  ? Smokeless tobacco: Never  ? Tobacco comments:  ?  < 1 pack per week  ?Substance and Sexual Activity  ? Alcohol use: No  ?  Comment: quit 5 yrs ago,  2014  ? Drug use: Never  ? Sexual activity: Not Currently  ?Other Topics Concern  ? Not on file  ?Social History Narrative  ? Lives wih son,  Works at [employer] ([employer]  AD.  Children 4.  Caffeine, drinks iced tea, and occ soda.  ? ?Social Determinants of Health  ? ?Financial Resource Strain: Not on file  ?Food Insecurity: Not on file  ?Transportation Needs: Not on file  ?Physical Activity: Not on file  ?Stress: Not on file  ?Social Connections: Not on file  ?Intimate Partner Violence: Not on file  ? ? ? ?PHYSICAL EXAM ? ?GENERAL EXAM/CONSTITUTIONAL: ?Vitals:  ?Vitals:  ? 03/15/22 1101  ?BP: 137/66  ?Pulse: (!) 59  ?Weight: 160 lb 6.4 oz (72.8 kg)  ?Height: 5' 10.5" (1.791 m)  ? ?Body mass index is 22.69 kg/m?. ?Wt Readings from Last 3 Encounters:  ?03/15/22 160 lb 6.4 oz (72.8 kg)  ?03/09/21 169 lb (76.7 kg)  ?01/20/21 170 lb  (77.1 kg)  ? ?Patient is in no distress; well developed, nourished and groomed; neck is supple ? ?CARDIOVASCULAR: ?Examination of carotid arteries is normal; no carotid bruits ?Regular rate and rhythm, no murmurs ?Examination of peripheral vascular system by observation and palpation is normal ? ?EYES: ?Ophthalmoscopic exam of optic discs and posterior segments is normal; no papilledema or hemorrhages ?No results found. ? ?MUSCULOSKELETAL: ?Gait, strength, tone, movements noted in Neurologic exam below ? ?NEUROLOGIC: ?MENTAL STATUS:  ?   ? View : No data to display.  ?  ?  ?  ? ?awake, alert, oriented to person, place and time ?recent and remote memory intact ?normal attention and concentration ?language fluent, comprehension intact, naming intact ?fund  of knowledge appropriate ? ?CRANIAL NERVE:  ?2nd - no papilledema on fundoscopic exam ?2nd, 3rd, 4th, 6th - pupils equal and reactive to light, visual fields full to confrontation, extraocular muscles intact, no nystagmus ?5th - facial sensation symmetric ?7th - facial strength symmetric ?8th - hearing intact ?9th - palate elevates symmetrically, uvula midline ?11th - shoulder shrug symmetric ?12th - tongue protrusion midline ? ?MOTOR:  ?BUE 5/5, normal tone ?BLE INCREASED TONE; 5 PROX; RIGHT DF 4 ? ?SENSORY:  ?DECR IN RIGHT FOOT ?OTHER WISE normal and symmetric to light touch, temperature, vibration ? ?COORDINATION:  ?finger-nose-finger, fine finger movements normal ? ?REFLEXES:  ?deep tendon reflexes --> BUE 2; KNEES 2, ANKLES 1 ? ?GAIT/STATION:  ?MODERATE SPASTIC GAIT; UNSTEADY; USES CANE ? ? ? ? ?DIAGNOSTIC DATA (LABS, IMAGING, TESTING) ?- I reviewed patient records, labs, notes, testing and imaging myself where available. ? ?Lab Results  ?Component Value Date  ? WBC 6.1 10/23/2018  ? HGB 13.1 10/23/2018  ? HCT 39.5 10/23/2018  ? MCV 84 10/23/2018  ? PLT 238 10/23/2018  ? ?   ?Component Value Date/Time  ? NA 144 10/23/2018 1121  ? K 4.6 10/23/2018 1121  ? CL 107  (H) 10/23/2018 1121  ? CO2 23 10/23/2018 1121  ? GLUCOSE 108 (H) 10/23/2018 1121  ? GLUCOSE 90 03/18/2016 1515  ? BUN 14 10/23/2018 1121  ? CREATININE 1.26 (H) 10/23/2018 1121  ? CREATININE 1.26 (H)

## 2022-03-15 NOTE — Telephone Encounter (Signed)
Patient in office today for FU. She stated her work is asking for a work accommodations to be filled out. She needs 5 extra minutes at lunch and end of day due to her difficulty walking and needs microwave or oven placed on top of cart for her ease of use while at work. She had copy of letter from [employer]. I called and spoke with  Zella Ball, gave her ID# [id number]. She will fax copy of accommodations form, should receive it within 24 hours.   ?

## 2022-03-15 NOTE — Telephone Encounter (Signed)
[employer] medicare order sent to [facility], [employer] they will reach out to the patient to schedule.  

## 2022-03-17 NOTE — Telephone Encounter (Signed)
Have not received paperwork, called [employer] spoke with Olegario Messier, call was cut off and Monique answered. She will send request over to Marylene Land who is patient's Dance movement psychotherapist. Gabriel Rung stated she was unable to give me a direct # for [employer].  ?

## 2022-03-21 NOTE — Telephone Encounter (Signed)
Called [employer], spoke with [employer] dept who stated they cannot speak to me, patient must call them and give permission for them to fax paperwork to me. Phone [phone] ?Called patient and advised her. She will call. Patient verbalized understanding, appreciation. ? ?

## 2022-03-21 NOTE — Telephone Encounter (Signed)
Still have not received paperwork for patient's ADA accomodation, called [employer], spoke with Danae Chen who transferred me to another dept, she stated they do not have a direct line. I was on hold 20 minutes, will call back later.  ?

## 2022-03-26 NOTE — Progress Notes (Deleted)
?Cardiology Office Note:   ? ?Date:  03/26/2022  ? ?ID:  Ana Edwards, DOB 08/29/1956, MRN [id number] ? ?PCP:  Dixie Dials, MD  ?Cardiologist:  Quay Burow, MD  ?Electrophysiologist:  None  ? ?Referring MD: Dixie Dials, MD  ? ?Chief Complaint: follow-up of PAD ? ?History of Present Illness:   ? ?Ana Edwards is a 66 y.o. female with a history of PAD s/p atherectomy/stenting of right SFA in 01/2014, hypertension, hyperlipidemia, CKD stage III, multiple sclerosis, GERD, and prior tobacco abuse who is followed by Dr. Gwenlyn Found and presents today for follow-up of PAD. ? ?Patient was referred to Dr. Alvester Chou in 01/2014 for further evaluation of PAD due to lifestyle limiting claudication.  Dopplers prior to the visit showed multilevel obstructive disease with moderately diminished ABIs.  Decision was made to proceed with peripheral angiogram which showed an occluded left SFA at its origin reconstituting in the abductor canal and high-grade calcified stenosis of right SFA s/p diamondback orbital rotational atherectomy, PTCA, and stenting of the right SFA.  He denies increased from 0.69-0.76 with resolution of her claudication on the right.  Patient was last seen by Dr. Gwenlyn Found in 01/2021 at which time she was doing well with no claudication, chest pain, or shortness of breath.  Most recent lower extremity Dopplers in 12/2021 showed patent right proximal to distal SFA stent with increase velocities in the testing 50-99% restenosis.  ABI was 0.69 on the right and 0.60 on the left.  Office visit was recommended to discuss progression of disease within her right SFA stent but had to cancel multiple of these visits. ? ?Patient presents today for follow-up. *** ? ?PAD ?History of bilateral SFA disease with occluded SFA and high grade right SFA disease noted on angiogram in 01/2014 s/p atherectomy and stenting of right SFA at that time. Lower extremity dopplers in 12/2021 showed patent right proximal to distal SFA stent with increase  velocities in the testing 50-99% restenosis.  ABI was 0.69 on the right and 0.60 on the left. ?- *** ?- Continue Plavix 75mg  daily.  ?- Continue Lipitor 80mg  daily and Zetia 10mg  daily.  ? ?Hypertension ?BP well controlled. ?- Continue Amlodipine 10mg  daily and Lopressor 12.5mg  twice daily. ? ?Hyperlipidemia ?Lipid panel in 12/2021: Total Cholesterol 122, Triglycerides 63, HDL 60, LDL 49. LDL goal <70 given PAD. ?- Continue Lipitor 80mg  daily and Zetia 10mg  daily. ? ?CKD Stage III ?*** ? ?Past Medical History:  ?Diagnosis Date  ? Arthritis   ? "legs sometimes" (02/03/2014)  ? CKD (chronic kidney disease) stage 3, GFR 30-59 ml/min (HCC) 02/04/2014  ? Claudication [facility])   ? GERD (gastroesophageal reflux disease)   ? Hyperlipidemia   ? Hypertension   ? Multiple sclerosis ([place]) 10/23/2018  ? recently dx by dr Leta Baptist   ? Neuromuscular disorder ([place])   ? PAD (peripheral artery disease) ([place])   ? Tobacco abuse   ? patient says she stopped smoking June 2015  ? ? ?Past Surgical History:  ?Procedure Laterality Date  ? LOWER EXTREMITY ANGIOGRAM N/A 02/03/2014  ? Procedure: LOWER EXTREMITY ANGIOGRAM;  Surgeon: Lorretta Harp, MD;  Location[facility] CATH LAB;  Service: Cardiovascular;  Laterality: N/A;  ? PERIPHERAL ATHRECTOMY Right 02/03/2014  ? w/stent SFA  ? ? ?Current Medications: ?No outpatient medications have been marked as taking for the 03/30/22 encounter (Appointment) with Darreld Mclean, PA-C.  ?  ? ?Allergies:   Tetracyclines & related  ? ?Social History  ? ?Socioeconomic History  ?  Marital status: Single  ?  Spouse name: Not on file  ? Number of children: 4  ? Years of education: Not on file  ? Highest education level: Not on file  ?Occupational History  ? Not on file  ?Tobacco Use  ? Smoking status: Former  ?  Packs/day: 0.12  ?  Years: 18.00  ?  Pack years: 2.16  ?  Types: Cigarettes  ?  Quit date: 01/2020  ?  Years since quitting: 2.1  ? Smokeless tobacco: Never  ? Tobacco comments:  ?  < 1 pack per week   ?Substance and Sexual Activity  ? Alcohol use: No  ?  Comment: quit 5 yrs ago,  2014  ? Drug use: Never  ? Sexual activity: Not Currently  ?Other Topics Concern  ? Not on file  ?Social History Narrative  ? Lives wih son,  Works at [employer] ([employer]).  AD.  Children 4.  Caffeine, drinks iced tea, and occ soda.  ? ?Social Determinants of Health  ? ?Financial Resource Strain: Not on file  ?Food Insecurity: Not on file  ?Transportation Needs: Not on file  ?Physical Activity: Not on file  ?Stress: Not on file  ?Social Connections: Not on file  ?  ? ?Family History: ?The patient's family history includes Arthritis in her father; Heart attack in her brother; Heart disease in her mother. ? ?ROS:   ?Please see the history of present illness.    ? ?EKGs/Labs/Other Studies Reviewed:   ? ?The following studies were reviewed today: ? ?Lower Extremity Arterial Ultrasound/ ABIs 12/30/2021: ?Summary:  ?Right: Severe progression is noted compared to previous study.  ? ?Heterogeneous plaque throughout.  ?Essentially stable 50-74% stenosis in the proximal CFA.  ?Patent right proximal to distal SFA stent with increase velocities in the  ?mid stent segment suggesting 50-99% restenosis. ? ?ABI Summary: ?Right: Resting right ankle-brachial index indicates moderate right lower  ?extremity arterial disease (0.69). The right toe-brachial index is abnormal.  ? ?Left: Resting left ankle-brachial index indicates moderate left lower  ?extremity arterial disease (0.60). The left toe-brachial index is abnormal. ? ?Right ABIs appear decreased compared to prior study on 12/30/2020. Left ABIs and bilateral TBIs appear essentially unchanged compared to prior study on 12/30/2020.  ? ?EKG:  EKG ordered today. EKG personally reviewed and demonstrates ***. ? ?Recent Labs: ?12/30/2021: ALT 11  ?Recent Lipid Panel ?   ?Component Value Date/Time  ? CHOL 122 12/30/2021 0950  ? TRIG 63 12/30/2021 0950  ? HDL 60 12/30/2021 0950  ? CHOLHDL 2.0 12/30/2021 0950   ? CHOLHDL 3.6 03/18/2016 1515  ? VLDL 22 03/18/2016 1515  ? LDLCALC 49 12/30/2021 0950  ? ? ?Physical Exam:   ? ?Vital Signs: There were no vitals taken for this visit.   ? ?Wt Readings from Last 3 Encounters:  ?03/15/22 160 lb 6.4 oz (72.8 kg)  ?03/09/21 169 lb (76.7 kg)  ?01/20/21 170 lb (77.1 kg)  ?  ? ?General: 66 y.o. female in no acute distress. ?HEENT: Normocephalic and atraumatic. Sclera clear. EOMs intact. ?Neck: Supple. No carotid bruits. No JVD. ?Heart: *** RRR. Distinct S1 and S2. No murmurs, gallops, or rubs. Radial and distal pedal pulses 2+ and equal bilaterally. ?Lungs: No increased work of breathing. Clear to ausculation bilaterally. No wheezes, rhonchi, or rales.  ?Abdomen: Soft, non-distended, and non-tender to palpation. Bowel sounds present in all 4 quadrants.  ?MSK: Normal strength and tone for age. *** ?Extremities: No lower extremity edema.    ?  Skin: Warm and dry. ?Neuro: Alert and oriented x3. No focal deficits. ?Psych: Normal affect. Responds appropriately. ? ? ?Assessment:   ? ?No diagnosis found. ? ?Plan:   ? ? ?Disposition: Follow up in *** ? ? ?Medication Adjustments/Labs and Tests Ordered: ?Current medicines are reviewed at length with the patient today.  Concerns regarding medicines are outlined above.  ?No orders of the defined types were placed in this encounter. ? ?No orders of the defined types were placed in this encounter. ? ? ?There are no Patient Instructions on file for this visit.  ? ?Signed, ?Darreld Mclean, PA-C  ?03/26/2022 12:33 PM    ?[place] ?

## 2022-03-27 ENCOUNTER — Other Ambulatory Visit: Payer: Self-pay | Admitting: Diagnostic Neuroimaging

## 2022-03-27 DIAGNOSIS — G35 Multiple sclerosis: Secondary | ICD-10-CM

## 2022-03-28 DIAGNOSIS — G35 Multiple sclerosis: Secondary | ICD-10-CM | POA: Insufficient documentation

## 2022-03-28 DIAGNOSIS — K219 Gastro-esophageal reflux disease without esophagitis: Secondary | ICD-10-CM | POA: Insufficient documentation

## 2022-03-29 NOTE — Telephone Encounter (Signed)
As of today we have not received any additional paper work on this request. Will monitor.  ?

## 2022-03-29 NOTE — Telephone Encounter (Signed)
Pt asking for a call back to confirm nurse has received paperwork. ?

## 2022-03-30 ENCOUNTER — Ambulatory Visit: Payer: Medicare Other | Admitting: Student

## 2022-03-30 ENCOUNTER — Encounter: Payer: Self-pay | Admitting: *Deleted

## 2022-03-30 DIAGNOSIS — G35 Multiple sclerosis: Secondary | ICD-10-CM

## 2022-04-05 ENCOUNTER — Ambulatory Visit
Admission: RE | Admit: 2022-04-05 | Discharge: 2022-04-05 | Disposition: A | Discharge status: Home / Self Care | Payer: Medicare Other | Source: Ambulatory Visit | Attending: Diagnostic Neuroimaging | Admitting: Diagnostic Neuroimaging

## 2022-04-05 DIAGNOSIS — R269 Unspecified abnormalities of gait and mobility: Secondary | ICD-10-CM

## 2022-04-05 DIAGNOSIS — G35 Multiple sclerosis: Secondary | ICD-10-CM

## 2022-04-05 MED ORDER — GADOBENATE DIMEGLUMINE 529 MG/ML IV SOLN
15.0000 mL | Freq: Once | INTRAVENOUS | Status: AC | PRN
  Administered 2022-04-05: 15 mL via INTRAVENOUS

## 2022-04-07 NOTE — Progress Notes (Deleted)
Cardiology Office Note:    Date:  04/07/2022   ID:  Ana Edwards, DOB 1956-12-18, MRN [id number]  PCP:  Orpah Cobb, MD   [facility] Providers Cardiologist:  Nanetta Batty, MD { Click to update primary MD,subspecialty MD or APP then REFRESH:1}    Referring MD: Orpah Cobb, MD   No chief complaint on file. ***  History of Present Illness:    Ana Edwards is a 66 y.o. female with a hx of former smoker, MS, PAD s/p diamondback orbital rotational atherectomy, PTCA, and stenting of the right SFA. She also has SCK stage III, GERD, HTN, and HLD.   Recent ABI wit lower extremity arterial duplex with stable right ABI and patent right SFA stent. She has total left SFA, but no claudication.     PAD S/P intervention of right SFA Evidence of occluded left SFA, but asymptomatic   Hypertension - continue amlodipine and BB   Hyperlipidemia with LDL < 70 12/30/2021: Cholesterol, Total 122; HDL 60; LDL Chol Calc (NIH) 49; Triglycerides 63 - continue liptor and zetia       Past Medical History:  Diagnosis Date   Arthritis    "legs sometimes" (02/03/2014)   CKD (chronic kidney disease) stage 3, GFR 30-59 ml/min (HCC) 02/04/2014   Claudication (HCC)    GERD (gastroesophageal reflux disease)    Hyperlipidemia    Hypertension    Multiple sclerosis (HCC) 10/23/2018   recently dx by dr Marjory Lies    Neuromuscular disorder [facility])    PAD (peripheral artery disease) (HCC)    Tobacco abuse    patient says she stopped smoking June 2015    Past Surgical History:  Procedure Laterality Date   LOWER EXTREMITY ANGIOGRAM N/A 02/03/2014   Procedure: LOWER EXTREMITY ANGIOGRAM;  Surgeon: Runell Gess, MD;  Location: [facility] CATH LAB;  Service: Cardiovascular;  Laterality: N/A;   PERIPHERAL ATHRECTOMY Right 02/03/2014   w/stent SFA    Current Medications: No outpatient medications have been marked as taking for the 04/20/22 encounter (Appointment) with Marcelino Duster, PA.      Allergies:   Tetracyclines & related   Social History   Socioeconomic History   Marital status: Single    Spouse name: Not on file   Number of children: 4   Years of education: Not on file   Highest education level: Not on file  Occupational History   Not on file  Tobacco Use   Smoking status: Former    Packs/day: 0.12    Years: 18.00    Pack years: 2.16    Types: Cigarettes    Quit date: 01/2020    Years since quitting: 2.2   Smokeless tobacco: Never   Tobacco comments:    < 1 pack per week  Substance and Sexual Activity   Alcohol use: No    Comment: quit 5 yrs ago,  2014   Drug use: Never   Sexual activity: Not Currently  Other Topics Concern   Not on file  Social History Narrative   Lives wih son,  Works at [employer] ([employer]  AD.  Children 4.  Caffeine, drinks iced tea, and occ soda.   Social Determinants of Health   Financial Resource Strain: Not on file  Food Insecurity: Not on file  Transportation Needs: Not on file  Physical Activity: Not on file  Stress: Not on file  Social Connections: Not on file     Family History: The patient's ***family history includes Arthritis in her  father; Heart attack in her brother; Heart disease in her mother.  ROS:   Please see the history of present illness.    *** All other systems reviewed and are negative.  EKGs/Labs/Other Studies Reviewed:    The following studies were reviewed today: ***  EKG:  EKG is *** ordered today.  The ekg ordered today demonstrates ***  Recent Labs: 12/30/2021: ALT 11  Recent Lipid Panel    Component Value Date/Time   CHOL 122 12/30/2021 0950   TRIG 63 12/30/2021 0950   HDL 60 12/30/2021 0950   CHOLHDL 2.0 12/30/2021 0950   CHOLHDL 3.6 03/18/2016 1515   VLDL 22 03/18/2016 1515   LDLCALC 49 12/30/2021 0950     Risk Assessment/Calculations:   {Does this patient have ATRIAL FIBRILLATION?:[phone]}       Physical Exam:    VS:  There were no vitals taken for this  visit.    Wt Readings from Last 3 Encounters:  03/15/22 160 lb 6.4 oz (72.8 kg)  03/09/21 169 lb (76.7 kg)  01/20/21 170 lb (77.1 kg)     GEN: *** Well nourished, well developed in no acute distress HEENT: Normal NECK: No JVD; No carotid bruits LYMPHATICS: No lymphadenopathy CARDIAC: ***RRR, no murmurs, rubs, gallops RESPIRATORY:  Clear to auscultation without rales, wheezing or rhonchi  ABDOMEN: Soft, non-tender, non-distended MUSCULOSKELETAL:  No edema; No deformity  SKIN: Warm and dry NEUROLOGIC:  Alert and oriented x 3 PSYCHIATRIC:  Normal affect   ASSESSMENT:    No diagnosis found. PLAN:    In order of problems listed above:  ***      {Are you ordering a CV Procedure (e.g. stress test, cath, DCCV, TEE, etc)?   Press F2        :[id number]}    Medication Adjustments/Labs and Tests Ordered: Current medicines are reviewed at length with the patient today.  Concerns regarding medicines are outlined above.  No orders of the defined types were placed in this encounter.  No orders of the defined types were placed in this encounter.   There are no Patient Instructions on file for this visit.   Signed, Marcelino Duster, PA  04/07/2022 3:20 PM    [facility]

## 2022-04-08 ENCOUNTER — Ambulatory Visit: Payer: Medicare Other | Admitting: Cardiovascular Disease

## 2022-04-20 ENCOUNTER — Ambulatory Visit: Payer: Medicare Other | Admitting: Physician Assistant

## 2022-04-25 ENCOUNTER — Other Ambulatory Visit: Payer: Self-pay | Admitting: Cardiovascular Disease

## 2022-05-13 ENCOUNTER — Telehealth: Payer: Self-pay | Admitting: Cardiovascular Disease

## 2022-05-13 MED ORDER — METOPROLOL TARTRATE 25 MG PO TABS: 12.5000 mg | ORAL_TABLET | Freq: Two times a day (BID) | ORAL | 0 refills | Status: DC

## 2022-05-13 MED ORDER — CLOPIDOGREL BISULFATE 75 MG PO TABS: ORAL_TABLET | ORAL | 0 refills | Status: DC

## 2022-05-13 MED ORDER — ATORVASTATIN CALCIUM 80 MG PO TABS: 80.0000 mg | ORAL_TABLET | Freq: Every day | ORAL | 3 refills | Status: DC

## 2022-05-13 MED ORDER — AMLODIPINE BESYLATE 10 MG PO TABS: 10.0000 mg | ORAL_TABLET | Freq: Every day | ORAL | 0 refills | Status: DC

## 2022-05-13 NOTE — Telephone Encounter (Signed)
*  STAT* If patient is at the pharmacy, call can be transferred to refill team.   1. Which medications need to be refilled? (please list name of each medication and dose if known)   amLODipine (NORVASC) 10 MG tablet    atorvastatin (LIPITOR) 80 MG tablet    clopidogrel (PLAVIX) 75 MG tablet    metoprolol tartrate (LOPRESSOR) 25 MG tablet    2. Which pharmacy/location (including street and city if local pharmacy) is medication to be sent to? [facility] - [place], [place] - [address]  3. Do they need a 30 day or 90 day supply?  90 day

## 2022-05-17 ENCOUNTER — Ambulatory Visit: Payer: Medicare Other | Admitting: Cardiovascular Disease

## 2022-06-07 ENCOUNTER — Other Ambulatory Visit: Payer: Self-pay | Admitting: Diagnostic Neuroimaging

## 2022-06-07 DIAGNOSIS — G35 Multiple sclerosis: Secondary | ICD-10-CM

## 2022-06-14 ENCOUNTER — Ambulatory Visit: Payer: Medicare Other | Admitting: Cardiovascular Disease

## 2022-08-17 ENCOUNTER — Other Ambulatory Visit: Payer: Self-pay | Admitting: Cardiovascular Disease

## 2022-08-26 ENCOUNTER — Other Ambulatory Visit: Payer: Self-pay | Admitting: Diagnostic Neuroimaging

## 2022-08-26 DIAGNOSIS — G35 Multiple sclerosis: Secondary | ICD-10-CM

## 2022-10-08 ENCOUNTER — Other Ambulatory Visit: Payer: Self-pay | Admitting: Cardiovascular Disease

## 2022-11-04 ENCOUNTER — Other Ambulatory Visit: Payer: Self-pay | Admitting: Diagnostic Neuroimaging

## 2022-11-04 DIAGNOSIS — G35 Multiple sclerosis: Secondary | ICD-10-CM

## 2022-11-07 ENCOUNTER — Other Ambulatory Visit: Payer: Self-pay | Admitting: Cardiovascular Disease

## 2023-01-04 ENCOUNTER — Other Ambulatory Visit: Payer: Self-pay | Admitting: Cardiovascular Disease

## 2023-01-09 ENCOUNTER — Other Ambulatory Visit: Payer: Self-pay | Admitting: Diagnostic Neuroimaging

## 2023-01-09 DIAGNOSIS — G35 Multiple sclerosis: Secondary | ICD-10-CM

## 2023-02-10 ENCOUNTER — Other Ambulatory Visit: Payer: Self-pay | Admitting: Cardiovascular Disease

## 2023-03-06 ENCOUNTER — Other Ambulatory Visit: Payer: Self-pay | Admitting: Cardiovascular Disease

## 2023-03-17 ENCOUNTER — Telehealth: Payer: Self-pay | Admitting: Cardiovascular Disease

## 2023-03-17 MED ORDER — AMLODIPINE BESYLATE 10 MG PO TABS: ORAL_TABLET | ORAL | 0 refills | Status: DC

## 2023-03-17 NOTE — Telephone Encounter (Signed)
*  STAT* If patient is at the pharmacy, call can be transferred to refill team.   1. Which medications need to be refilled? (please list name of each medication and dose if known)  amLODipine (NORVASC) 10 MG tablet   2. Which pharmacy/location (including street and city if local pharmacy) is medication to be sent to?  [facility]    3. Do they need a 30 day or 90 day supply? 90 day

## 2023-04-04 ENCOUNTER — Encounter: Payer: Self-pay | Admitting: Diagnostic Neuroimaging

## 2023-04-04 ENCOUNTER — Ambulatory Visit: Payer: Medicare Other | Admitting: Diagnostic Neuroimaging

## 2023-04-04 VITALS — BP 140/76 | HR 65 | Ht 70.5 in | Wt 166.2 lb

## 2023-04-04 DIAGNOSIS — G35 Multiple sclerosis: Secondary | ICD-10-CM

## 2023-04-04 NOTE — Progress Notes (Signed)
[facility]  PATIENT: Ana Edwards DOB: Oct 11, 1956  REFERRING CLINICIAN: Orpah Cobb, MD  HISTORY FROM: patient  REASON FOR VISIT: follow up   HISTORICAL  CHIEF COMPLAINT:  Chief Complaint  Patient presents with   Follow-up    RM 7, alone. Last seen 03/15/22. MS DMT: glatiramer acetate. Tolerating well. Doing well, no concerns. Ambulates with cane.    HISTORY OF PRESENT ILLNESS:   UPDATE (04/04/23, VRP): Since last visit, doing well. Symptoms are stable. No alleviating or aggravating factors. Tolerating copaxone.     UPDATE (03/15/22, VRP): Since last visit, doing well. Symptoms are stable. Severity is mild. No alleviating or aggravating factors. Tolerating generic copaxone.    UPDATE (03/09/21, VRP): Since last visit, doing well. Symptoms are stable. No alleviating or aggravating factors. Tolerating meds.    UPDATE (06/09/20, VRP): Since last visit, doing WELL. Symptoms are stable. Working with PT and planning to get AFO to help gait and stability on right leg. No alleviating or aggravating factors. Tolerating meds. Had covid vaccine (no issues).   UPDATE (09/10/19, VRP): Since last visit, doing well. Symptoms are stable. Severity is mild. No alleviating or aggravating factors. Tolerating copaxone (generic).    UPDATE (03/05/19, VRP): Since last visit, doing about the same. Having some confusion and diff getting started on ocrevus. Symptoms are stable. Severity is mild. No alleviating or aggravating factors.    UPDATE (10/23/18, VRP): Since last visit, doing about the same. Symptoms are stable. Severity is moderate. No alleviating or aggravating factors. MRI scans reviewed. Right > Left leg stiffness continues. Denies any vision changes, slurred speech, hand problems.  PRIOR HPI (07/16/18): 67 year old female here for evaluation of lower extremity stiffness.  Patient reports at least one year of lower extremity stiffness and difficulty walking.  I spoke with  patient's daughter via phone who tells me that symptoms have been going on for at least 3 years, progressively worsening.  Patient feels some increased stiffness in her right greater than left leg.  Symptoms are worse when is cold outside.  Symptoms are slightly improved when it is warmer patient has been moving around for a while.  She denies any numbness or tingling.  Patient fell down 3 years ago due to this problem.  She also fell down 2 years ago off of her front steps.    Patient denies any bowel or bladder incontinence.  She denies any problems with her arms or hands.  No slurred speech, vision changes or trouble talking.  Saw orthopedic clinic for evaluation.  X-rays and lab work were tested and apparently unremarkable.   REVIEW OF SYSTEMS: Full 14 system review of systems performed and negative with exception of: as per HPI.  ALLERGIES: Allergies  Allergen Reactions   Tetracyclines & Related Nausea And Vomiting and Other (See Comments)    sweats    HOME MEDICATIONS: Outpatient Medications Prior to Visit  Medication Sig Dispense Refill   amLODipine (NORVASC) 10 MG tablet TAKE 1 TABLET BY MOUTH ONCE DAILY . APPOINTMENT REQUIRED FOR FUTURE REFILLS 30 tablet 0   atorvastatin (LIPITOR) 80 MG tablet Take 1 tablet (80 mg total) by mouth daily. 90 tablet 3   clopidogrel (PLAVIX) 75 MG tablet TAKE 1 TABLET BY MOUTH ONCE DAILY IN THE MORNING WITH  BREAKFAST. APPOINTMENT REQUIRED FOR FUTURE REFILLS. 30 tablet 1   Glatiramer Acetate 40 MG/ML SOSY INJECT  SUBCUTANEOUSLY THREE TIMES WEEKLY AT LEAST 48 HOURS  APART 12 mL 2   metoprolol tartrate (LOPRESSOR) 25  MG tablet Take 0.5 tablets (12.5 mg total) by mouth 2 (two) times daily. 90 tablet 0   ezetimibe (ZETIA) 10 MG tablet Take 1 tablet (10 mg total) by mouth daily. 90 tablet 3   No facility-administered medications prior to visit.    PAST MEDICAL HISTORY: Past Medical History:  Diagnosis Date   Arthritis    "legs sometimes"  (02/03/2014)   CKD (chronic kidney disease) stage 3, GFR 30-59 ml/min (HCC) 02/04/2014   Claudication (HCC)    GERD (gastroesophageal reflux disease)    Hyperlipidemia    Hypertension    Multiple sclerosis (HCC) 10/23/2018   recently dx by dr Marjory Lies    Neuromuscular disorder [facility])    PAD (peripheral artery disease) (HCC)    Tobacco abuse    patient says she stopped smoking June 2015    PAST SURGICAL HISTORY: Past Surgical History:  Procedure Laterality Date   LOWER EXTREMITY ANGIOGRAM N/A 02/03/2014   Procedure: LOWER EXTREMITY ANGIOGRAM;  Surgeon: Runell Gess, MD;  Location: [facility] CATH LAB;  Service: Cardiovascular;  Laterality: N/A;   PERIPHERAL ATHRECTOMY Right 02/03/2014   w/stent SFA    FAMILY HISTORY: Family History  Problem Relation Age of Onset   Heart disease Mother    Arthritis Father    Heart attack Brother     SOCIAL HISTORY: Social History   Socioeconomic History   Marital status: Single    Spouse name: Not on file   Number of children: 4   Years of education: Not on file   Highest education level: Not on file  Occupational History   Not on file  Tobacco Use   Smoking status: Former    Packs/day: 0.12    Years: 18.00    Additional pack years: 0.00    Total pack years: 2.16    Types: Cigarettes    Quit date: 01/2020    Years since quitting: 3.2   Smokeless tobacco: Never   Tobacco comments:    < 1 pack per week  Substance and Sexual Activity   Alcohol use: No    Comment: quit 5 yrs ago,  2014   Drug use: Never   Sexual activity: Not Currently  Other Topics Concern   Not on file  Social History Narrative   Lives wih son,  Works at [employer] ([employer]).  AD.  Children 4.  Caffeine, drinks iced tea, and occ soda.   Social Determinants of Health   Financial Resource Strain: Not on file  Food Insecurity: Not on file  Transportation Needs: Not on file  Physical Activity: Not on file  Stress: No Stress Concern Present (12/05/2018)    [employer] of Occupational Health - Occupational Stress Questionnaire    Feeling of Stress : Not at all  Social Connections: Not on file  Intimate Partner Violence: Not on file     PHYSICAL EXAM  GENERAL EXAM/CONSTITUTIONAL: Vitals:  Vitals:   04/04/23 1519 04/04/23 1528  BP: (!) 165/82 (!) 140/76  Pulse: 67 65  Weight: 166 lb 3.2 oz (75.4 kg)   Height: 5' 10.5" (1.791 m)    Body mass index is 23.51 kg/m. Wt Readings from Last 3 Encounters:  04/04/23 166 lb 3.2 oz (75.4 kg)  03/15/22 160 lb 6.4 oz (72.8 kg)  03/09/21 169 lb (76.7 kg)   Patient is in no distress; well developed, nourished and groomed; neck is supple  CARDIOVASCULAR: Examination of carotid arteries is normal; no carotid bruits Regular rate and rhythm, no murmurs Examination  of peripheral vascular system by observation and palpation is normal  EYES: Ophthalmoscopic exam of optic discs and posterior segments is normal; no papilledema or hemorrhages No results found.  MUSCULOSKELETAL: Gait, strength, tone, movements noted in Neurologic exam below  NEUROLOGIC: MENTAL STATUS:      No data to display         awake, alert, oriented to person, place and time recent and remote memory intact normal attention and concentration language fluent, comprehension intact, naming intact fund of knowledge appropriate  CRANIAL NERVE:  2nd - no papilledema on fundoscopic exam 2nd, 3rd, 4th, 6th - pupils equal and reactive to light, visual fields full to confrontation, extraocular muscles intact, no nystagmus 5th - facial sensation symmetric 7th - facial strength symmetric 8th - hearing intact 9th - palate elevates symmetrically, uvula midline 11th - shoulder shrug symmetric 12th - tongue protrusion midline  MOTOR:  BUE 5/5, normal tone BLE INCREASED TONE; 5 PROX; RIGHT DF 4  SENSORY:  DECR IN RIGHT FOOT OTHER WISE normal and symmetric to light touch, temperature, vibration  COORDINATION:   finger-nose-finger, fine finger movements normal; SLOW RIGHT FOOT TAPPING  REFLEXES:  deep tendon reflexes --> BUE 2; KNEES 2, ANKLES 1  GAIT/STATION:  MODERATE SPASTIC GAIT; UNSTEADY; USES CANE     DIAGNOSTIC DATA (LABS, IMAGING, TESTING) - I reviewed patient records, labs, notes, testing and imaging myself where available.  Lab Results  Component Value Date   WBC 6.1 10/23/2018   HGB 13.1 10/23/2018   HCT 39.5 10/23/2018   MCV 84 10/23/2018   PLT 238 10/23/2018      Component Value Date/Time   NA 144 10/23/2018 1121   K 4.6 10/23/2018 1121   CL 107 (H) 10/23/2018 1121   CO2 23 10/23/2018 1121   GLUCOSE 108 (H) 10/23/2018 1121   GLUCOSE 90 03/18/2016 1515   BUN 14 10/23/2018 1121   CREATININE 1.26 (H) 10/23/2018 1121   CREATININE 1.26 (H) 03/18/2016 1515   CALCIUM 9.6 10/23/2018 1121   PROT 6.6 12/30/2021 0950   ALBUMIN 4.0 12/30/2021 0950   AST 14 12/30/2021 0950   ALT 11 12/30/2021 0950   ALKPHOS 89 12/30/2021 0950   BILITOT 0.4 12/30/2021 0950   GFRNONAA 46 (L) 10/23/2018 1121   GFRAA 53 (L) 10/23/2018 1121   Lab Results  Component Value Date   CHOL 122 12/30/2021   HDL 60 12/30/2021   LDLCALC 49 12/30/2021   TRIG 63 12/30/2021   CHOLHDL 2.0 12/30/2021   No results found for: "HGBA1C" No results found for: "VITAMINB12" Lab Results  Component Value Date   TSH 1.83 03/18/2016   Vit D, 25-Hydroxy  Date Value Ref Range Status  10/23/2018 16.1 (L) 30.0 - 100.0 ng/mL Final    Comment:    Vitamin D deficiency has been defined by the Institute of Medicine and an Endocrine Society practice guideline as a level of serum 25-OH vitamin D less than 20 ng/mL (1,2). The Endocrine Society went on to further define vitamin D insufficiency as a level between 21 and 29 ng/mL (2). 1. IOM (Institute of Medicine). 2010. Dietary reference    intakes for calcium and D. [place]: The    [employer]. 2. Holick MF, Binkley [place], Bischoff-Ferrari HA, et  al.    Evaluation, treatment, and prevention of vitamin D    deficiency: an Endocrine Society clinical practice    guideline. JCEM. 2011 Jul; 96(7):1911-30.     05/02/18 Labs (Requested and reviewed from Dr.  Kendall office):  ANA - negative CRP 0.9 ESR 11 TSH 1.91 CCP < 16 RF < 14  07/17/18 MRI thoracic spine (with and without) [I reviewed images myself and agree with interpretation. -VRP]  - Mild disc bulging at T2-3, T5-6, T7-8. No spinal stenosis or foraminal narrowing.  - No intrinsic, compressive or abnormal enhancing spinal cord lesions. - Incidental right renal cyst (2.5cm).   07/17/18 MRI lumbar spine (without) [I reviewed images myself and agree with interpretation. -VRP]  - At L4-5: disc bulging and facet hypertrophy and ligamentum flavum hypertrophy with mild spinal stenosis and moderate biforaminal stenosis - At L3-4: disc bulging and facet hypertrophy with mild spinal stenosis and mild biforaminal stenosis - At L2-3: disc bulging and facet hypertrophy with mild biforaminal stenosis   10/18/18 MRI brain (with and without) [I reviewed images myself and agree with interpretation. -VRP]  - Multiple round and ovoid, confluent, periventricular, subcortical, cerebellar T2 hyperintensities.  Some of these are hypointense on T1 views. No abnormal lesions are seen on post contrast views. Considerations include autoimmune, inflammatory, demyelinating, post-infectious, or ischemic etiologies.  - See MRI cervical spine results from same day.  The presence of spinal cord lesions raises possibility of demyelinating disease.  10/18/18 MRI cervical (with and without) [I reviewed images myself and agree with interpretation. -VRP]  - The spinal cord is notable for multiple T2 and STIR hyperintense lesions at C2, C3, C5 and T2.  In combination with MRI brain findings from same day, this raises possibility of chronic demyelinating plaques.  Other autoimmune and inflammatory or parainfectious  conditions are also possible. - Mild degenerative changes as noted above.  04/05/22 MRI brain (with and without) demonstrating: - Mild to moderate periventricular, subcortical pontine and cerebellar chronic demyelinating disease.  No abnormal lesions are seen on post contrast views.   - Compared to MRI on 10/17/2018 no significant change  04/05/22 MRI cervical spine (with and without) demonstrating: - Chronic demyelinating plaques at C2, C3-4, C5-6 levels.  No acute plaques. - Multilevel degenerative changes as above. - Overall no significant change from 10/17/2018.    ASSESSMENT AND PLAN  67 y.o. year old female here with progressive muscle stiffness, gait difficulty, lower extremely weakness since 2016; history, exam and MRI consistent with RRMS.    Dx: CNS autoimmune / inflamm (brain, cervical)  1. 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    Suanne Marker, MD 04/04/2023, 4:08 PM Certified in Neurology, Neurophysiology and Neuroimaging  [facility] [address], [address] [place], [place] [address] [phone]

## 2023-04-12 ENCOUNTER — Other Ambulatory Visit: Payer: Self-pay | Admitting: Cardiovascular Disease

## 2023-04-14 ENCOUNTER — Ambulatory Visit: Payer: Medicare Other | Admitting: Student

## 2023-04-14 ENCOUNTER — Telehealth: Payer: Self-pay | Admitting: Cardiovascular Disease

## 2023-04-14 ENCOUNTER — Telehealth: Payer: Self-pay | Admitting: Cardiology

## 2023-04-14 MED ORDER — AMLODIPINE BESYLATE 10 MG PO TABS: ORAL_TABLET | ORAL | 0 refills | Status: DC

## 2023-04-14 MED ORDER — CLOPIDOGREL BISULFATE 75 MG PO TABS: 75.0000 mg | ORAL_TABLET | Freq: Every day | ORAL | 0 refills | Status: DC

## 2023-04-14 MED ORDER — CLOPIDOGREL BISULFATE 75 MG PO TABS: ORAL_TABLET | ORAL | 0 refills | Status: DC

## 2023-04-14 MED ORDER — AMLODIPINE BESYLATE 10 MG PO TABS: 10.0000 mg | ORAL_TABLET | Freq: Every day | ORAL | 0 refills | Status: DC

## 2023-04-14 NOTE — Telephone Encounter (Signed)
*  STAT* If patient is at the pharmacy, call can be transferred to refill team.   1. Which medications need to be refilled? (please list name of each medication and dose if known) amLODipine (NORVASC) 10 MG tablet  clopidogrel (PLAVIX) 75 MG tablet   2. Which pharmacy/location (including street and city if local pharmacy) is medication to be sent to?  [facility] - [place], [place] - [address]    3. Do they need a 30 day or 90 day supply? 90    Pt states she is out

## 2023-04-14 NOTE — Telephone Encounter (Signed)
Pt c/o medication issue:  1. Name of Medication:  clopidogrel (PLAVIX) 75 MG tablet  amLODipine (NORVASC) 10 MG tablet   2. How are you currently taking this medication (dosage and times per day)?   3. Are you having a reaction (difficulty breathing--STAT)?   4. What is your medication issue? [facility] needs clarification on directions. Please advise.

## 2023-04-14 NOTE — Telephone Encounter (Signed)
Pharmacy called to verify RX instructions as only states "keep OV" with no opther direction.Marland Kitchen  Updated the prescription with dosage and sent for 45 days until patient is seen in office.

## 2023-04-23 ENCOUNTER — Other Ambulatory Visit: Payer: Self-pay | Admitting: Diagnostic Neuroimaging

## 2023-04-23 DIAGNOSIS — G35 Multiple sclerosis: Secondary | ICD-10-CM

## 2023-05-03 ENCOUNTER — Other Ambulatory Visit: Payer: Self-pay | Admitting: Cardiovascular Disease

## 2023-05-21 NOTE — Progress Notes (Signed)
Cardiology Clinic Note   Date: 05/26/2023 ID: ALETTE KATAOKA, DOB 04-30-1956, MRN [id number]  Primary Cardiologist:  Nanetta Batty, MD  Patient Profile    Ana Edwards is a 67 y.o. female who presents to the clinic today for overdue routine follow up.   Past medical history significant for: PAD. PVD angiogram 02/03/2014: Occluded left SFA probably best treated by femoral-popliteal bypass grafting.  Diffusely diseased proximal mid right SFA with one-vessel runoff.  Successful diamondback orbital rotational atherectomy, PTA and stenting right SFA. Arterial ultrasound lower extremities/ABI's 12/30/2021: Right: Heterogeneous plaque throughout.  Essentially stable 50 to 74% stenosis in the proximal CFA.  Patent right proximal to distal SFA stent with increased velocities in the mid stent segment suggesting 50 to 99% restenosis.  Moderate lower extremity arterial disease bilaterally. Hypertension. Hyperlipidemia. Lipid panel 12/30/2021: LDL 49, HDL 60, TG 63, total 122. GERD. MS. CKD stage III. Tobacco abuse. Continues to smoke occasionally when under stress.    History of Present Illness    Ana Edwards was first evaluated by Dr. Allyson Sabal on 01/24/2014 for PVD evaluation secondary to life limiting claudication as requested by Dr. Leeanne Deed.  She underwent angiogram with successful atherectomy, PTA and stenting of right SFA.  She also had occluded left SFA felt to be best treated with femoral-popliteal bypass grafting.  Upon follow-up in March 2016 patient denied claudication and left lower extremity therefore she has been treated medically.  Patient was last seen in the office by Dr. Allyson Sabal on 01/20/2021 for follow-up.  He continued to deny claudication.  No changes made.  Not been seen since.  She underwent repeat arterial ultrasound and ABIs of lower extremities which showed patent right proximal and distal SFA stent with increased velocities in the mid stent segment suggesting 50 to 99%  restenosis.  She was instructed to follow-up with an office visit however it does not appear she was seen.  Today, patient is doing well. Patient denies shortness of breath or dyspnea on exertion. No chest pain, pressure, or tightness. Denies lower extremity edema, orthopnea, or PND. No palpitations. She reports a vague "sensation" right medial thigh but not painful. She denies claudication. 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 She also stays busy at home taking care of her grandson and performing cooking/light to moderate household chores. She continues to smoke occasionally when she is stressed. She has MS and gets stiffness in her back and bilateral hips but was told it has not progressed since she was first diagnosed in 2019.     ROS: All other systems reviewed and are otherwise negative except as noted in History of Present Illness.  Studies Reviewed    ECG personally reviewed by me today: Sinus bradycardia with sinus arrhythmia, 53 bpm.  No significant changes from 01/20/2021.       Physical Exam    VS:  BP (!) 126/58 (BP Location: Right Arm, Patient Position: Sitting, Cuff Size: Normal)   Pulse (!) 53   Ht 5\' 11"  (1.803 m)   Wt 168 lb (76.2 kg)   BMI 23.43 kg/m  , BMI Body mass index is 23.43 kg/m.  GEN: Well nourished, well developed, in no acute distress. Neck: No JVD or carotid bruits. Cardiac:  RRR. No murmurs. No rubs or gallops.   Respiratory:  Respirations regular and unlabored. Clear to auscultation without rales, wheezing or rhonchi. GI: Soft, nontender, nondistended. Extremities: Radials 2+ and equal bilaterally.  Diminished /DP/PT bilaterally. No clubbing or cyanosis. No edema.  Skin: Warm and dry, no rash. Neuro: Strength intact.  Assessment & Plan    PAD.  S/p atherectomy, PTA and stenting right SFA February 2015.  Occluded left SFA felt to be best treated with femoral-popliteal bypass grafting however,  patient denied claudication and has been treated medically.  Arterial ultrasound of lower extremities and ABIs January 2023 showed progression of disease with 50 to 99% restenosis of the mid stent in the right proximal to distal SFA.  Moderate lower extremity arterial disease bilaterally.  Patient reports a vague sensation in medial right thigh but no pain. She denies claudication. 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 She is also independent with ADLs and light to moderate household chores/cooking. Given lack of symptoms and ability to perform all desired activities will not repeat testing at this point. Continue Plavix, atorvastatin. Hypertension. BP today 126/58. Patient denies headaches, dizziness or vision changes. Continue amlodipine, metoprolol. Hyperlipidemia.  LDL January 2023 49, at goal.  Continue atorvastatin. Will get repeat lipid panel and CMP today. Tobacco abuse. Patient continues to smoke on occasion when she is stressed. Discussed finding other coping strategies for complete cessation.   Disposition: Lipid panel and CMP today. Return in 6 months with Dr. Allyson Sabal or sooner as needed.         Signed, Etta Grandchild. Airlie Blumenberg, DNP, NP-C

## 2023-05-24 ENCOUNTER — Encounter: Payer: Self-pay | Admitting: Anesthesiology

## 2023-05-24 ENCOUNTER — Telehealth: Payer: Self-pay | Admitting: Diagnostic Neuroimaging

## 2023-05-24 ENCOUNTER — Other Ambulatory Visit: Payer: Self-pay | Admitting: Cardiology

## 2023-05-24 ENCOUNTER — Other Ambulatory Visit: Payer: Self-pay | Admitting: Anesthesiology

## 2023-05-24 DIAGNOSIS — G35 Multiple sclerosis: Secondary | ICD-10-CM

## 2023-05-24 MED ORDER — GLATIRAMER ACETATE 40 MG/ML ~~LOC~~ SOSY: PREFILLED_SYRINGE | SUBCUTANEOUS | 2 refills | Status: DC

## 2023-05-24 NOTE — Telephone Encounter (Signed)
Refill on Glatiramer Acetate 40 mg has been sent to [facility] mail order pharmacy. Pt was informed through a MyChart message.

## 2023-05-24 NOTE — Telephone Encounter (Signed)
Pt is requesting a refill for Glatiramer Acetate 40 MG/ML SOSY .  Pharmacy: [facility] Pt has been told by [facility] that she has no refills

## 2023-05-26 ENCOUNTER — Encounter: Payer: Self-pay | Admitting: Student

## 2023-05-26 ENCOUNTER — Ambulatory Visit: Payer: Medicare Other | Attending: Student | Admitting: Student

## 2023-05-26 VITALS — BP 126/58 | HR 53 | Ht 71.0 in | Wt 168.0 lb

## 2023-05-26 DIAGNOSIS — I739 Peripheral vascular disease, unspecified: Secondary | ICD-10-CM | POA: Diagnosis not present

## 2023-05-26 DIAGNOSIS — I10 Essential (primary) hypertension: Secondary | ICD-10-CM | POA: Diagnosis not present

## 2023-05-26 DIAGNOSIS — E782 Mixed hyperlipidemia: Secondary | ICD-10-CM

## 2023-05-26 DIAGNOSIS — Z720 Tobacco use: Secondary | ICD-10-CM

## 2023-05-26 MED ORDER — AMLODIPINE BESYLATE 10 MG PO TABS: ORAL_TABLET | ORAL | 3 refills | Status: DC

## 2023-05-26 MED ORDER — METOPROLOL TARTRATE 25 MG PO TABS: 12.5000 mg | ORAL_TABLET | Freq: Two times a day (BID) | ORAL | 3 refills | Status: DC

## 2023-05-26 MED ORDER — EZETIMIBE 10 MG PO TABS: 10.0000 mg | ORAL_TABLET | Freq: Every day | ORAL | 3 refills | Status: DC

## 2023-05-26 MED ORDER — ATORVASTATIN CALCIUM 80 MG PO TABS: 80.0000 mg | ORAL_TABLET | Freq: Every day | ORAL | 3 refills | Status: DC

## 2023-05-26 MED ORDER — CLOPIDOGREL BISULFATE 75 MG PO TABS: 75.0000 mg | ORAL_TABLET | Freq: Every day | ORAL | 3 refills | Status: DC

## 2023-05-26 NOTE — Patient Instructions (Signed)
Medication Instructions:  No changes *If you need a refill on your cardiac medications before your next appointment, please call your pharmacy*   Lab Work: Lipid Panel, CMP If you have labs (blood work) drawn today and your tests are completely normal, you will receive your results only by: MyChart Message (if you have MyChart) OR A paper copy in the mail If you have any lab test that is abnormal or we need to change your treatment, we will call you to review the results.   Testing/Procedures: No Testing   Follow-Up: At [facility], you and your health needs are our priority.  As part of our continuing mission to provide you with exceptional heart care, we have created designated Provider Care Teams.  These Care Teams include your primary Cardiologist (physician) and Advanced Practice Providers (APPs -  Physician Assistants and Nurse Practitioners) who all work together to provide you with the care you need, when you need it.  We recommend signing up for the patient portal called "MyChart".  Sign up information is provided on this After Visit Summary.  MyChart is used to connect with patients for Virtual Visits (Telemedicine).  Patients are able to view lab/test results, encounter notes, upcoming appointments, etc.  Non-urgent messages can be sent to your provider as well.   To learn more about what you can do with MyChart, go to [email or web address].    Your next appointment:   Keep Scheduled Appointment  Provider:   Nanetta Batty, MD

## 2023-05-27 LAB — COMPREHENSIVE METABOLIC PANEL
ALT: 10 IU/L (ref 0–32)
AST: 17 IU/L (ref 0–40)
Albumin/Globulin Ratio: 1.6 (ref 1.2–2.2)
Albumin: 4.4 g/dL (ref 3.9–4.9)
Alkaline Phosphatase: 92 IU/L (ref 44–121)
BUN/Creatinine Ratio: 10 — ABNORMAL LOW (ref 12–28)
BUN: 12 mg/dL (ref 8–27)
Bilirubin Total: 0.5 mg/dL (ref 0.0–1.2)
CO2: 23 mmol/L (ref 20–29)
Calcium: 9.8 mg/dL (ref 8.7–10.3)
Chloride: 103 mmol/L (ref 96–106)
Creatinine, Ser: 1.21 mg/dL — ABNORMAL HIGH (ref 0.57–1.00)
Globulin, Total: 2.8 g/dL (ref 1.5–4.5)
Glucose: 101 mg/dL — ABNORMAL HIGH (ref 70–99)
Potassium: 4.6 mmol/L (ref 3.5–5.2)
Sodium: 140 mmol/L (ref 134–144)
Total Protein: 7.2 g/dL (ref 6.0–8.5)
eGFR: 49 mL/min/{1.73_m2} — ABNORMAL LOW (ref >59)

## 2023-05-27 LAB — LIPID PANEL
Chol/HDL Ratio: 2.9 ratio (ref 0.0–4.4)
Cholesterol, Total: 168 mg/dL (ref 100–199)
HDL: 58 mg/dL (ref >39)
LDL Chol Calc (NIH): 94 mg/dL (ref 0–99)
Triglycerides: 84 mg/dL (ref 0–149)
VLDL Cholesterol Cal: 16 mg/dL (ref 5–40)

## 2023-08-06 ENCOUNTER — Other Ambulatory Visit: Payer: Self-pay | Admitting: Diagnostic Neuroimaging

## 2023-08-06 DIAGNOSIS — G35 Multiple sclerosis: Secondary | ICD-10-CM

## 2023-11-01 ENCOUNTER — Other Ambulatory Visit: Payer: Self-pay | Admitting: Diagnostic Neuroimaging

## 2023-11-01 DIAGNOSIS — G35 Multiple sclerosis: Secondary | ICD-10-CM

## 2023-11-29 ENCOUNTER — Ambulatory Visit: Payer: Medicare Other | Admitting: Cardiovascular Disease

## 2024-01-03 ENCOUNTER — Other Ambulatory Visit: Payer: Self-pay | Admitting: Diagnostic Neuroimaging

## 2024-01-03 DIAGNOSIS — G35 Multiple sclerosis: Secondary | ICD-10-CM

## 2024-01-03 NOTE — Telephone Encounter (Addendum)
 Last seen on 04/04/23 Follow up scheduled on 04/08/24  Rx was sent on 11/01/23 12 ML with 2 refills.   I called patient she does have Rx already. Pt said she has about 4 pens at home now. I will approve for future refills

## 2024-01-29 ENCOUNTER — Ambulatory Visit: Payer: Medicare Other | Admitting: Cardiovascular Disease

## 2024-02-21 ENCOUNTER — Ambulatory Visit: Payer: Medicare Other | Admitting: Cardiovascular Disease

## 2024-02-28 ENCOUNTER — Other Ambulatory Visit: Payer: Self-pay | Admitting: Neurology

## 2024-02-28 NOTE — Telephone Encounter (Signed)
 Handicap placcard completed and signed and given to medical records

## 2024-03-11 ENCOUNTER — Encounter: Payer: Self-pay | Admitting: Cardiovascular Disease

## 2024-03-11 ENCOUNTER — Ambulatory Visit: Payer: Medicare Other | Attending: Cardiovascular Disease | Admitting: Cardiovascular Disease

## 2024-03-11 VITALS — BP 130/72 | HR 60 | Ht 71.0 in | Wt 174.8 lb

## 2024-03-11 DIAGNOSIS — I739 Peripheral vascular disease, unspecified: Secondary | ICD-10-CM | POA: Diagnosis not present

## 2024-03-11 DIAGNOSIS — R0989 Other specified symptoms and signs involving the circulatory and respiratory systems: Secondary | ICD-10-CM | POA: Diagnosis not present

## 2024-03-11 DIAGNOSIS — I10 Essential (primary) hypertension: Secondary | ICD-10-CM | POA: Diagnosis not present

## 2024-03-11 DIAGNOSIS — E782 Mixed hyperlipidemia: Secondary | ICD-10-CM | POA: Diagnosis not present

## 2024-03-11 NOTE — Assessment & Plan Note (Signed)
 History of essential hypertension with blood pressure measured today at 130/72.  She is on amlodipine and metoprolol.

## 2024-03-11 NOTE — Patient Instructions (Addendum)
 Medication Instructions:  Your physician recommends that you continue on your current medications as directed. Please refer to the Current Medication list given to you today.  *If you need a refill on your cardiac medications before your next appointment, please call your pharmacy*   Lab Work: Your physician recommends that you have labs drawn today: Lipid/liver panel  If you have labs (blood work) drawn today and your tests are completely normal, you will receive your results only by: MyChart Message (if you have MyChart) OR A paper copy in the mail If you have any lab test that is abnormal or we need to change your treatment, we will call you to review the results.   Testing/Procedures: Your physician has requested that you have a carotid duplex. This test is an ultrasound of the carotid arteries in your neck. It looks at blood flow through these arteries that supply the brain with blood. Allow one hour for this exam. There are no restrictions or special instructions. This will take place at [address] [facility], [address].  Please note: We ask at that you not bring children with you during ultrasound (echo/ vascular) testing. Due to room size and safety concerns, children are not allowed in the ultrasound rooms during exams. Our front office staff cannot provide observation of children in our lobby area while testing is being conducted. An adult accompanying a patient to their appointment will only be allowed in the ultrasound room at the discretion of the ultrasound technician under special circumstances. We apologize for any inconvenience.  Your physician has requested that you have a lower extremity arterial duplex. During this test, ultrasound is used to evaluate arterial blood flow in the legs. Allow one hour for this exam. There are no restrictions or special instructions. This will take place at [address] [facility], [address].  Please note: We ask at that you not bring children with you  during ultrasound (echo/ vascular) testing. Due to room size and safety concerns, children are not allowed in the ultrasound rooms during exams. Our front office staff cannot provide observation of children in our lobby area while testing is being conducted. An adult accompanying a patient to their appointment will only be allowed in the ultrasound room at the discretion of the ultrasound technician under special circumstances. We apologize for any inconvenience.  Your physician has requested that you have an ankle brachial index (ABI). During this test an ultrasound and blood pressure cuff are used to evaluate the arteries that supply the arms and legs with blood. Allow thirty minutes for this exam. There are no restrictions or special instructions. This will take place at [address] [facility], [address].    Please note: We ask at that you not bring children with you during ultrasound (echo/ vascular) testing. Due to room size and safety concerns, children are not allowed in the ultrasound rooms during exams. Our front office staff cannot provide observation of children in our lobby area while testing is being conducted. An adult accompanying a patient to their appointment will only be allowed in the ultrasound room at the discretion of the ultrasound technician under special circumstances. We apologize for any inconvenience.    Follow-Up: At [facility], you and your health needs are our priority.  As part of our continuing mission to provide you with exceptional heart care, we have created designated Provider Care Teams.  These Care Teams include your primary Cardiologist (physician) and Advanced Practice Providers (APPs -  Physician Assistants and  Nurse Practitioners) who all work together to provide you with the care you need, when you need it.  We recommend signing up for the patient portal called "MyChart".  Sign up information is provided on this After Visit Summary.  MyChart is used to  connect with patients for Virtual Visits (Telemedicine).  Patients are able to view lab/test results, encounter notes, upcoming appointments, etc.  Non-urgent messages can be sent to your provider as well.   To learn more about what you can do with MyChart, go to [email or web address].    Your next appointment:   12 month(s)  Provider:   Robet Leu, PA-C, Azalee Course, PA-C, Bernadene Person, NP, or Reather Littler, NP    \    Then, Nanetta Batty, MD will plan to see you again in 2 year(s).   Other Instructions   1st Floor: - Lobby - Registration  - Pharmacy  - Lab - Cafe  2nd Floor: - PV Lab - Diagnostic Testing (echo, CT, nuclear med)  3rd Floor: - Vacant  4th Floor: - TCTS (cardiothoracic surgery) - AFib Clinic - Structural Heart Clinic - Vascular Surgery  - Vascular Ultrasound  5th Floor: - [facility] (general and EP) - Clinical Pharmacy for coumadin, hypertension, lipid, weight-loss medications, and med management appointments    Valet parking services will be available as well.

## 2024-03-11 NOTE — Progress Notes (Signed)
 03/11/2024 Ana Edwards   1956-05-27  [id number]  Primary Physician Orpah Cobb, MD Primary Cardiologist: Runell Gess MD Nicholes Calamity, [place]  HPI:  Ana Edwards is a 68 y.o.  moderately overweight divorced African American female mother of 4, grandmother of 7 grandchildren who was referred by Dr. Santiago Bumpers for peripheral vascular evaluation because of life style  limiting claudication. I last saw her in the office 01/20/2021. Her risk factors include discontinued tobacco abuse and hypertension. She's not diabetic nor is a family history of heart disease. She has never had a heart attack or stroke patient denies chest pain or shortness of breath. 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 The patient relates Progressive symptoms over the last 6 months. Left is greater than right. She presents today for angiography and potential intervention. The Doppler suggests an occluded left SFA with high-grade right SFA disease. I angiogram to her on 02/03/14 revealing an occluded left SFA at its origin reconstituting in the abductor canal with high-grade calcified right SFA stenosis. I ultimately performed diamondback orbital rotational atherectomy, PTCA and stenting using an IDET V stent resulting in excellent and clinical result. She had 1 vessel runoff below the knee the peroneal artery.Her ABI increased from 0.69 to .76 with resolution of her claudication on the right. She currently denies claudication.   Since I saw her in the office 3 years ago she remained stable.  She was diagnosed with multiple sclerosis which has remained stable.  She continues to smoke approxi-2 cigarettes a day.  She denies chest pain shortness of breath or claudication.  Her left lower extremity arterial Doppler studies performed 12/30/2021 revealed a patent right SFA stent with moderate in-stent restenosis.   Current Meds  Medication  Sig   amLODipine (NORVASC) 10 MG tablet TAKE 1 TABLET BY MOUTH ONCE DAILY *   atorvastatin (LIPITOR) 80 MG tablet Take 1 tablet (80 mg total) by mouth daily.   clopidogrel (PLAVIX) 75 MG tablet Take 1 tablet (75 mg total) by mouth daily.   Glatiramer Acetate 40 MG/ML SOSY INJECT 40MG  SUBCUTANEOUSLY THREE TIMES WEEKLY AT LEAST 48 HOURS  APART   metoprolol tartrate (LOPRESSOR) 25 MG tablet Take 0.5 tablets (12.5 mg total) by mouth 2 (two) times daily.     Allergies  Allergen Reactions   Tetracyclines & Related Nausea And Vomiting and Other (See Comments)    sweats    Social History   Socioeconomic History   Marital status: Single    Spouse name: Not on file   Number of children: 4   Years of education: Not on file   Highest education level: Not on file  Occupational History   Not on file  Tobacco Use   Smoking status: Former    Current packs/day: 0.00    Average packs/day: 0.1 packs/day for 18.0 years (2.2 ttl pk-yrs)    Types: Cigarettes    Start date: 01/2002    Quit date: 01/2020    Years since quitting: 4.1   Smokeless tobacco: Never   Tobacco comments:    < 1 pack per week  Substance and Sexual Activity   Alcohol use: No    Comment: quit 5 yrs ago,  2014   Drug use: Never   Sexual activity: Not Currently  Other Topics Concern   Not on file  Social History Narrative   Lives wih  son,  Works at [employer] ([employer]  AD.  Children 4.  Caffeine, drinks iced tea, and occ soda.   Social Drivers of Corporate investment banker Strain: Not on file  Food Insecurity: Not on file  Transportation Needs: Not on file  Physical Activity: Not on file  Stress: No Stress Concern Present (12/05/2018)   [employer] of Occupational Health - Occupational Stress Questionnaire    Feeling of Stress : Not at all  Social Connections: Not on file  Intimate Partner Violence: Not on file     Review of Systems: General: negative for chills, fever, night sweats or weight changes.   Cardiovascular: negative for chest pain, dyspnea on exertion, edema, orthopnea, palpitations, paroxysmal nocturnal dyspnea or shortness of breath Dermatological: negative for rash Respiratory: negative for cough or wheezing Urologic: negative for hematuria Abdominal: negative for nausea, vomiting, diarrhea, bright red blood per rectum, melena, or hematemesis Neurologic: negative for visual changes, syncope, or dizziness All other systems reviewed and are otherwise negative except as noted above.    Blood pressure 130/72, pulse 60, height 5\' 11"  (1.803 m), weight 174 lb 12.8 oz (79.3 kg).  General appearance: alert and no distress Neck: no adenopathy, no JVD, supple, symmetrical, trachea midline, thyroid not enlarged, symmetric, no tenderness/mass/nodules, and high-frequency left carotid bruit. Lungs: clear to auscultation bilaterally Heart: regular rate and rhythm, S1, S2 normal, no murmur, click, rub or gallop Extremities: extremities normal, atraumatic, no cyanosis or edema Pulses: 2+ and symmetric Skin: Skin color, texture, turgor normal. No rashes or lesions Neurologic: Grossly normal  EKG EKG Interpretation Date/Time:  Monday March 11 2024 09:43:51 EDT Ventricular Rate:  60 PR Interval:  216 QRS Duration:  94 QT Interval:  412 QTC Calculation: 412 R Axis:   -13  Text Interpretation: Sinus rhythm with 1st degree A-V block Incomplete right bundle branch block Minimal voltage criteria for LVH, may be normal variant ( Cornell product ) Septal infarct , age undetermined When compared with ECG of 10-Jan-2012 05:18, Incomplete right bundle branch block is now Present Septal infarct is now Present Confirmed by Nanetta Batty [phone]) on 03/11/2024 9:50:57 AM    ASSESSMENT AND PLAN:   Hypertension History of essential hypertension with blood pressure measured today at 130/72.  She is on amlodipine and metoprolol.  Claudication, lifestyle limiting History of PAD status post  peripheral angiogram performed by myself 02/03/2014 revealing an occluded left SFA at its origin and reconstituted in the adductor canal with high-grade calcified right SFA stenosis.  Ultimately performed diamondback orbital rotational atherectomy, PTA and stenting using a superior self-expanding stent.  This resulted in increase in her right ABI from 0.69 up to 0.76 with resolution of her claudication.  She did have only one-vessel runoff below the knee via peroneal artery.  Her last Doppler study performed 12/30/2021 revealed her stent to be patent with moderate "in-stent restenosis although she denies claudication.  Will recheck lower extremity arterial Doppler studies.  Hyperlipidemia History of hyperlipidemia on high-dose atorvastatin and Zetia with lipid profile last checked 05/26/2023 revealing total cholesterol 168, LDL 94 and HDL of 58.  Will recheck a fasting lipid liver profile.     Runell Gess MD FACP,FACC,FAHA, [facility] 03/11/2024 9:59 AM

## 2024-03-11 NOTE — Assessment & Plan Note (Signed)
 History of hyperlipidemia on high-dose atorvastatin and Zetia with lipid profile last checked 05/26/2023 revealing total cholesterol 168, LDL 94 and HDL of 58.  Will recheck a fasting lipid liver profile.

## 2024-03-11 NOTE — Assessment & Plan Note (Addendum)
 History of PAD status post peripheral angiogram performed by myself 02/03/2014 revealing an occluded left SFA at its origin and reconstituted in the adductor canal with high-grade calcified right SFA stenosis.  Ultimately performed diamondback orbital rotational atherectomy, PTA and stenting using a superior self-expanding stent.  This resulted in increase in her right ABI from 0.69 up to 0.76 with resolution of her claudication.  She did have only one-vessel runoff below the knee via peroneal artery.  Her last Doppler study performed 12/30/2021 revealed her stent to be patent with moderate "in-stent restenosis although she denies claudication.  Will recheck lower extremity arterial Doppler studies.

## 2024-03-12 LAB — HEPATIC FUNCTION PANEL
ALT: 9 IU/L (ref 0–32)
AST: 16 IU/L (ref 0–40)
Albumin: 4.3 g/dL (ref 3.9–4.9)
Alkaline Phosphatase: 101 IU/L (ref 44–121)
Bilirubin Total: 0.3 mg/dL (ref 0.0–1.2)
Bilirubin, Direct: 0.14 mg/dL (ref 0.00–0.40)
Total Protein: 6.9 g/dL (ref 6.0–8.5)

## 2024-03-12 LAB — LIPID PANEL
Chol/HDL Ratio: 2.3 ratio (ref 0.0–4.4)
Cholesterol, Total: 143 mg/dL (ref 100–199)
HDL: 63 mg/dL (ref >39)
LDL Chol Calc (NIH): 65 mg/dL (ref 0–99)
Triglycerides: 77 mg/dL (ref 0–149)
VLDL Cholesterol Cal: 15 mg/dL (ref 5–40)

## 2024-03-18 ENCOUNTER — Other Ambulatory Visit: Payer: Self-pay | Admitting: Diagnostic Neuroimaging

## 2024-03-18 DIAGNOSIS — G35 Multiple sclerosis: Secondary | ICD-10-CM

## 2024-04-01 ENCOUNTER — Ambulatory Visit (HOSPITAL_BASED_OUTPATIENT_CLINIC_OR_DEPARTMENT_OTHER)
Admission: RE | Admit: 2024-04-01 | Discharge: 2024-04-01 | Disposition: A | Discharge status: Home / Self Care | Source: Ambulatory Visit | Attending: Cardiovascular Disease | Admitting: Cardiovascular Disease

## 2024-04-01 ENCOUNTER — Ambulatory Visit (HOSPITAL_COMMUNITY)
Admission: RE | Admit: 2024-04-01 | Discharge: 2024-04-01 | Disposition: A | Discharge status: Home / Self Care | Source: Ambulatory Visit | Attending: Cardiovascular Disease | Admitting: Cardiovascular Disease

## 2024-04-01 DIAGNOSIS — E782 Mixed hyperlipidemia: Secondary | ICD-10-CM | POA: Diagnosis present

## 2024-04-01 DIAGNOSIS — I739 Peripheral vascular disease, unspecified: Secondary | ICD-10-CM | POA: Diagnosis not present

## 2024-04-01 DIAGNOSIS — R0989 Other specified symptoms and signs involving the circulatory and respiratory systems: Secondary | ICD-10-CM

## 2024-04-01 LAB — VAS US ABI WITH/WO TBI
Left ABI: 0.55
Right ABI: 0.45

## 2024-04-04 ENCOUNTER — Encounter: Payer: Self-pay | Admitting: Cardiology

## 2024-04-07 ENCOUNTER — Other Ambulatory Visit: Payer: Self-pay | Admitting: Diagnostic Neuroimaging

## 2024-04-07 DIAGNOSIS — G35 Multiple sclerosis: Secondary | ICD-10-CM

## 2024-04-08 ENCOUNTER — Ambulatory Visit (INDEPENDENT_AMBULATORY_CARE_PROVIDER_SITE_OTHER): Payer: Medicare Other | Admitting: Diagnostic Neuroimaging

## 2024-04-08 ENCOUNTER — Encounter: Payer: Self-pay | Admitting: Diagnostic Neuroimaging

## 2024-04-08 VITALS — BP 145/72 | HR 87 | Ht 70.0 in | Wt 171.0 lb

## 2024-04-08 DIAGNOSIS — G35 Multiple sclerosis: Secondary | ICD-10-CM | POA: Diagnosis not present

## 2024-04-08 NOTE — Progress Notes (Signed)
 [facility]  PATIENT: Ana Edwards DOB: 12-Jul-1956  REFERRING CLINICIAN: Pasqual Bone, MD  HISTORY FROM: patient  REASON FOR VISIT: follow up   HISTORICAL  CHIEF COMPLAINT:  Chief Complaint  Patient presents with   Multiple Sclerosis    Rm 7 alone Pt is well and stable, reports no new MS concerns since last visit    HISTORY OF PRESENT ILLNESS:   UPDATE (04/08/24, VRP): Since last visit, doing well. Symptoms are stable. Now retired since Oct 2024. May consider some part time work. Tolerating glatiramer .   UPDATE (04/04/23, VRP): Since last visit, doing well. Symptoms are stable. No alleviating or aggravating factors. Tolerating copaxone .     UPDATE (03/15/22, VRP): Since last visit, doing well. Symptoms are stable. Severity is mild. No alleviating or aggravating factors. Tolerating generic copaxone .    UPDATE (03/09/21, VRP): Since last visit, doing well. Symptoms are stable. No alleviating or aggravating factors. Tolerating meds.    UPDATE (06/09/20, VRP): Since last visit, doing WELL. Symptoms are stable. Working with PT and planning to get AFO to help gait and stability on right leg. No alleviating or aggravating factors. Tolerating meds. Had covid vaccine (no issues).   UPDATE (09/10/19, VRP): Since last visit, doing well. Symptoms are stable. Severity is mild. No alleviating or aggravating factors. Tolerating copaxone  (generic).    UPDATE (03/05/19, VRP): Since last visit, doing about the same. Having some confusion and diff getting started on ocrevus. Symptoms are stable. Severity is mild. No alleviating or aggravating factors.    UPDATE (10/23/18, VRP): Since last visit, doing about the same. Symptoms are stable. Severity is moderate. No alleviating or aggravating factors. MRI scans reviewed. Right > Left leg stiffness continues. Denies any vision changes, slurred speech, hand problems.  PRIOR HPI (07/16/18): 68 year old female here for evaluation of lower  extremity stiffness.  Patient reports at least one year of lower extremity stiffness and difficulty walking.  I spoke with patient's daughter via phone who tells me that symptoms have been going on for at least 3 years, progressively worsening.  Patient feels some increased stiffness in her right greater than left leg.  Symptoms are worse when is cold outside.  Symptoms are slightly improved when it is warmer patient has been moving around for a while.  She denies any numbness or tingling.  Patient fell down 3 years ago due to this problem.  She also fell down 2 years ago off of her front steps.    Patient denies any bowel or bladder incontinence.  She denies any problems with her arms or hands.  No slurred speech, vision changes or trouble talking.  Saw orthopedic clinic for evaluation.  X-rays and lab work were tested and apparently unremarkable.   REVIEW OF SYSTEMS: Full 14 system review of systems performed and negative with exception of: as per HPI.  ALLERGIES: Allergies  Allergen Reactions   Tetracyclines & Related Nausea And Vomiting and Other (See Comments)    sweats    HOME MEDICATIONS: Outpatient Medications Prior to Visit  Medication Sig Dispense Refill   amLODipine  (NORVASC ) 10 MG tablet TAKE 1 TABLET BY MOUTH ONCE DAILY * 90 tablet 3   atorvastatin  (LIPITOR) 80 MG tablet Take 1 tablet (80 mg total) by mouth daily. 90 tablet 3   clopidogrel  (PLAVIX ) 75 MG tablet Take 1 tablet (75 mg total) by mouth daily. 90 tablet 3   ezetimibe  (ZETIA ) 10 MG tablet Take 1 tablet (10 mg total) by mouth daily. 90 tablet  3   Glatiramer  Acetate 40 MG/ML SOSY INJECT 40MG  SUBCUTANEOUSLY THREE TIMES WEEKLY AT LEAST 48 HOURS  APART 12 mL 0   metoprolol  tartrate (LOPRESSOR ) 25 MG tablet Take 0.5 tablets (12.5 mg total) by mouth 2 (two) times daily. 90 tablet 3   No facility-administered medications prior to visit.    PAST MEDICAL HISTORY: Past Medical History:  Diagnosis Date   Arthritis     "legs sometimes" (02/03/2014)   CKD (chronic kidney disease) stage 3, GFR 30-59 ml/min (HCC) 02/04/2014   Claudication (HCC)    GERD (gastroesophageal reflux disease)    Hyperlipidemia    Hypertension    Multiple sclerosis (HCC) 10/23/2018   recently dx by dr Ana Edwards    Neuromuscular disorder [facility])    PAD (peripheral artery disease) (HCC)    Tobacco abuse    patient says she stopped smoking June 2015    PAST SURGICAL HISTORY: Past Surgical History:  Procedure Laterality Date   LOWER EXTREMITY ANGIOGRAM N/A 02/03/2014   Procedure: LOWER EXTREMITY ANGIOGRAM;  Surgeon: Ana Leigh, MD;  Location: [facility] CATH LAB;  Service: Cardiovascular;  Laterality: N/A;   PERIPHERAL ATHRECTOMY Right 02/03/2014   w/stent SFA    FAMILY HISTORY: Family History  Problem Relation Age of Onset   Heart disease Mother    Arthritis Father    Heart attack Brother     SOCIAL HISTORY: Social History   Socioeconomic History   Marital status: Single    Spouse name: Not on file   Number of children: 4   Years of education: Not on file   Highest education level: Not on file  Occupational History   Not on file  Tobacco Use   Smoking status: Former    Current packs/day: 0.00    Average packs/day: 0.1 packs/day for 18.0 years (2.2 ttl pk-yrs)    Types: Cigarettes    Start date: 01/2002    Quit date: 01/2020    Years since quitting: 4.2   Smokeless tobacco: Never   Tobacco comments:    < 1 pack per week  Substance and Sexual Activity   Alcohol use: No    Comment: quit 5 yrs ago,  2014   Drug use: Never   Sexual activity: Not Currently  Other Topics Concern   Not on file  Social History Narrative   Lives wih son,  Works at [employer] ([employer]  AD.  Children 4.  Caffeine, drinks iced tea, and occ soda.   Social Drivers of Corporate investment banker Strain: Not on file  Food Insecurity: Not on file  Transportation Needs: Not on file  Physical Activity: Not on file  Stress: No Stress  Concern Present (12/05/2018)   [employer] of Occupational Health - Occupational Stress Questionnaire    Feeling of Stress : Not at all  Social Connections: Not on file  Intimate Partner Violence: Not on file     PHYSICAL EXAM  GENERAL EXAM/CONSTITUTIONAL: Vitals:  Vitals:   04/08/24 1326  BP: (!) 145/72  Pulse: 87  Weight: 171 lb (77.6 kg)  Height: 5\' 10"  (1.778 m)   Body mass index is 24.54 kg/m. Wt Readings from Last 3 Encounters:  04/08/24 171 lb (77.6 kg)  03/11/24 174 lb 12.8 oz (79.3 kg)  05/26/23 168 lb (76.2 kg)   Patient is in no distress; well developed, nourished and groomed; neck is supple  CARDIOVASCULAR: Examination of carotid arteries is normal; no carotid bruits Regular rate and rhythm, no murmurs Examination  of peripheral vascular system by observation and palpation is normal  EYES: Ophthalmoscopic exam of optic discs and posterior segments is normal; no papilledema or hemorrhages No results found.  MUSCULOSKELETAL: Gait, strength, tone, movements noted in Neurologic exam below  NEUROLOGIC: MENTAL STATUS:      No data to display         awake, alert, oriented to person, place and time recent and remote memory intact normal attention and concentration language fluent, comprehension intact, naming intact fund of knowledge appropriate  CRANIAL NERVE:  2nd - no papilledema on fundoscopic exam 2nd, 3rd, 4th, 6th - pupils equal and reactive to light, visual fields full to confrontation, extraocular muscles intact, no nystagmus 5th - facial sensation symmetric 7th - facial strength symmetric 8th - hearing intact 9th - palate elevates symmetrically, uvula midline 11th - shoulder shrug symmetric 12th - tongue protrusion midline  MOTOR:  BUE 5/5, SLIGHTLY INCREASED TONE IN RUE > LUE BLE INCREASED TONE; 5 PROX; RIGHT DF 4  SENSORY:  DECR IN RIGHT FOOT OTHER WISE normal and symmetric to light touch, temperature,  vibration  COORDINATION:  finger-nose-finger, fine finger movements normal; SLOW RIGHT FOOT TAPPING  REFLEXES:  deep tendon reflexes --> BUE 2; KNEES 2, ANKLES 1  GAIT/STATION:  MODERATE SPASTIC GAIT; UNSTEADY; USES CANE     DIAGNOSTIC DATA (LABS, IMAGING, TESTING) - I reviewed patient records, labs, notes, testing and imaging myself where available.  Lab Results  Component Value Date   WBC 6.1 10/23/2018   HGB 13.1 10/23/2018   HCT 39.5 10/23/2018   MCV 84 10/23/2018   PLT 238 10/23/2018      Component Value Date/Time   NA 140 05/26/2023 1204   K 4.6 05/26/2023 1204   CL 103 05/26/2023 1204   CO2 23 05/26/2023 1204   GLUCOSE 101 (H) 05/26/2023 1204   GLUCOSE 90 03/18/2016 1515   BUN 12 05/26/2023 1204   CREATININE 1.21 (H) 05/26/2023 1204   CREATININE 1.26 (H) 03/18/2016 1515   CALCIUM  9.8 05/26/2023 1204   PROT 6.9 03/11/2024 1042   ALBUMIN 4.3 03/11/2024 1042   AST 16 03/11/2024 1042   ALT 9 03/11/2024 1042   ALKPHOS 101 03/11/2024 1042   BILITOT 0.3 03/11/2024 1042   GFRNONAA 46 (L) 10/23/2018 1121   GFRAA 53 (L) 10/23/2018 1121   Lab Results  Component Value Date   CHOL 143 03/11/2024   HDL 63 03/11/2024   LDLCALC 65 03/11/2024   TRIG 77 03/11/2024   CHOLHDL 2.3 03/11/2024   No results found for: "HGBA1C" No results found for: "VITAMINB12" Lab Results  Component Value Date   TSH 1.83 03/18/2016   Vit D, 25-Hydroxy  Date Value Ref Range Status  10/23/2018 16.1 (L) 30.0 - 100.0 ng/mL Final    Comment:    Vitamin D  deficiency has been defined by the Institute of Medicine and an Endocrine Society practice guideline as a level of serum 25-OH vitamin D  less than 20 ng/mL (1,2). The Endocrine Society went on to further define vitamin D  insufficiency as a level between 21 and 29 ng/mL (2). 1. IOM (Institute of Medicine). 2010. Dietary reference    intakes for calcium  and D. [place]: The    [employer]. 2. Holick MF, Binkley [place],  Bischoff-Ferrari HA, et al.    Evaluation, treatment, and prevention of vitamin D     deficiency: an Endocrine Society clinical practice    guideline. JCEM. 2011 Jul; 96(7):1911-30.     05/02/18 Labs (Requested  and reviewed from Dr. Jacqulyne Maxim office):  ANA - negative CRP 0.9 ESR 11 TSH 1.91 CCP < 16 RF < 14  07/17/18 MRI thoracic spine (with and without) [I reviewed images myself and agree with interpretation. -VRP]  - Mild disc bulging at T2-3, T5-6, T7-8. No spinal stenosis or foraminal narrowing.  - No intrinsic, compressive or abnormal enhancing spinal cord lesions. - Incidental right renal cyst (2.5cm).   07/17/18 MRI lumbar spine (without) [I reviewed images myself and agree with interpretation. -VRP]  - At L4-5: disc bulging and facet hypertrophy and ligamentum flavum hypertrophy with mild spinal stenosis and moderate biforaminal stenosis - At L3-4: disc bulging and facet hypertrophy with mild spinal stenosis and mild biforaminal stenosis - At L2-3: disc bulging and facet hypertrophy with mild biforaminal stenosis   10/18/18 MRI brain (with and without) [I reviewed images myself and agree with interpretation. -VRP]  - Multiple round and ovoid, confluent, periventricular, subcortical, cerebellar T2 hyperintensities.  Some of these are hypointense on T1 views. No abnormal lesions are seen on post contrast views. Considerations include autoimmune, inflammatory, demyelinating, post-infectious, or ischemic etiologies.  - See MRI cervical spine results from same day.  The presence of spinal cord lesions raises possibility of demyelinating disease.  10/18/18 MRI cervical (with and without) [I reviewed images myself and agree with interpretation. -VRP]  - The spinal cord is notable for multiple T2 and STIR hyperintense lesions at C2, C3, C5 and T2.  In combination with MRI brain findings from same day, this raises possibility of chronic demyelinating plaques.  Other autoimmune and inflammatory  or parainfectious conditions are also possible. - Mild degenerative changes as noted above.  04/05/22 MRI brain (with and without) demonstrating: - Mild to moderate periventricular, subcortical pontine and cerebellar chronic demyelinating disease.  No abnormal lesions are seen on post contrast views.   - Compared to MRI on 10/17/2018 no significant change  04/05/22 MRI cervical spine (with and without) demonstrating: - Chronic demyelinating plaques at C2, C3-4, C5-6 levels.  No acute plaques. - Multilevel degenerative changes as above. - Overall no significant change from 10/17/2018.    ASSESSMENT AND PLAN  68 y.o. year old female here with progressive muscle stiffness, gait difficulty, lower extremely weakness since 2016; history, exam and MRI consistent with RRMS.    Dx: CNS autoimmune / inflamm (brain, cervical)  1. Multiple sclerosis (HCC)     PLAN:  MULTIPLE SCLEROSIS (RRMS; brain and spinal cord lesions) - LEG STIFFNESS / WEAKNESS (stable) - on generic glatiramer ; doing well; may consider aubagio, mavenclad or dimethyl fumarate in future - optimize nutrition, exercise, sleep - stop smoking - optimize vitamin D  levels  Return in about 1 year (around 04/08/2025) for MyChart visit (15 min).    Omega Bible, MD 04/08/2024, 1:59 PM Certified in Neurology, Neurophysiology and Neuroimaging  [facility] [address] [place], [place] [address] [phone]

## 2024-05-08 ENCOUNTER — Other Ambulatory Visit: Payer: Self-pay | Admitting: Student

## 2024-06-05 ENCOUNTER — Other Ambulatory Visit: Payer: Self-pay | Admitting: Student

## 2024-06-26 ENCOUNTER — Other Ambulatory Visit: Payer: Self-pay | Admitting: Student

## 2024-08-10 ENCOUNTER — Other Ambulatory Visit: Payer: Self-pay | Admitting: Student

## 2024-08-13 ENCOUNTER — Telehealth: Payer: Self-pay | Admitting: Cardiovascular Disease

## 2024-08-13 MED ORDER — AMLODIPINE BESYLATE 10 MG PO TABS: ORAL_TABLET | ORAL | 1 refills | Status: DC

## 2024-08-13 NOTE — Telephone Encounter (Signed)
*  STAT* If patient is at the pharmacy, call can be transferred to refill team.   1. Which medications need to be refilled? (please list name of each medication and dose if known)   amLODipine  (NORVASC ) 10 MG tablet   2. Would you like to learn more about the convenience, safety, & potential cost savings by using the [facility]?   3. Are you open to using the [facility] (Type [facility]. ).  4. Which pharmacy/location (including street and city if local pharmacy) is medication to be sent to?  [facility] - [place], [place] - [address]   5. Do they need a 30 day or 90 day supply?   90 day  Patient stated she is completely out of this medication.

## 2024-08-13 NOTE — Telephone Encounter (Signed)
 RX sent in

## 2024-08-30 ENCOUNTER — Other Ambulatory Visit: Payer: Self-pay | Admitting: Diagnostic Neuroimaging

## 2024-08-30 DIAGNOSIS — G35 Multiple sclerosis: Secondary | ICD-10-CM

## 2025-01-23 ENCOUNTER — Other Ambulatory Visit: Payer: Self-pay | Admitting: Cardiovascular Disease

## 2025-04-08 ENCOUNTER — Ambulatory Visit: Admitting: Diagnostic Neuroimaging
